# Patient Record
Sex: Female | Born: 1965 | Race: White | Hispanic: No | Smoking: Never smoker
Health system: Southern US, Community
[De-identification: ages and names within clinical notes are randomized; demographics above are authoritative.]

## PROBLEM LIST (undated history)

## (undated) DIAGNOSIS — R569 Unspecified convulsions: Secondary | ICD-10-CM

## (undated) DIAGNOSIS — Z9889 Other specified postprocedural states: Secondary | ICD-10-CM

## (undated) DIAGNOSIS — R51 Headache: Secondary | ICD-10-CM

## (undated) DIAGNOSIS — G43909 Migraine, unspecified, not intractable, without status migrainosus: Secondary | ICD-10-CM

## (undated) DIAGNOSIS — I10 Essential (primary) hypertension: Secondary | ICD-10-CM

## (undated) DIAGNOSIS — R112 Nausea with vomiting, unspecified: Secondary | ICD-10-CM

## (undated) DIAGNOSIS — I8390 Asymptomatic varicose veins of unspecified lower extremity: Secondary | ICD-10-CM

## (undated) DIAGNOSIS — R519 Headache, unspecified: Secondary | ICD-10-CM

## (undated) DIAGNOSIS — M1990 Unspecified osteoarthritis, unspecified site: Secondary | ICD-10-CM

## (undated) DIAGNOSIS — S62101A Fracture of unspecified carpal bone, right wrist, initial encounter for closed fracture: Secondary | ICD-10-CM

## (undated) DIAGNOSIS — M7700 Medial epicondylitis, unspecified elbow: Secondary | ICD-10-CM

## (undated) DIAGNOSIS — I499 Cardiac arrhythmia, unspecified: Secondary | ICD-10-CM

## (undated) DIAGNOSIS — D649 Anemia, unspecified: Secondary | ICD-10-CM

## (undated) DIAGNOSIS — H18539 Granular corneal dystrophy, unspecified eye: Secondary | ICD-10-CM

## (undated) DIAGNOSIS — H1853 Granular corneal dystrophy: Secondary | ICD-10-CM

## (undated) HISTORY — PX: DILATION AND CURETTAGE OF UTERUS: SHX78

## (undated) HISTORY — PX: DIAGNOSTIC LAPAROSCOPY: SUR761

## (undated) HISTORY — PX: VARICOSE VEIN SURGERY: SHX832

---
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
## 2006-09-02 ENCOUNTER — Ambulatory Visit: Payer: Self-pay | Admitting: Vascular Surgery

## 2006-09-17 ENCOUNTER — Ambulatory Visit: Payer: Self-pay | Admitting: Vascular Surgery

## 2006-11-05 ENCOUNTER — Ambulatory Visit: Payer: Self-pay | Admitting: Vascular Surgery

## 2006-11-11 ENCOUNTER — Ambulatory Visit: Payer: Self-pay | Admitting: Vascular Surgery

## 2006-12-16 ENCOUNTER — Ambulatory Visit: Payer: Self-pay | Admitting: Vascular Surgery

## 2008-09-23 ENCOUNTER — Encounter: Admission: RE | Admit: 2008-09-23 | Discharge: 2008-09-23 | Payer: Self-pay | Admitting: Orthopedic Surgery

## 2008-10-06 ENCOUNTER — Ambulatory Visit (HOSPITAL_COMMUNITY): Admission: RE | Admit: 2008-10-06 | Discharge: 2008-10-06 | Payer: Self-pay | Admitting: Orthopedic Surgery

## 2009-06-08 ENCOUNTER — Ambulatory Visit (HOSPITAL_BASED_OUTPATIENT_CLINIC_OR_DEPARTMENT_OTHER): Admission: RE | Admit: 2009-06-08 | Discharge: 2009-06-08 | Payer: Self-pay | Admitting: Orthopedic Surgery

## 2009-06-08 HISTORY — PX: WRIST ARTHROSCOPY W/ TRIANGULAR FIBROCARTILAGE REPAIR: SHX2670

## 2009-10-30 ENCOUNTER — Emergency Department (HOSPITAL_COMMUNITY): Admission: EM | Admit: 2009-10-30 | Discharge: 2009-10-30 | Payer: Self-pay | Admitting: Family Medicine

## 2010-11-01 LAB — POCT HEMOGLOBIN-HEMACUE: Hemoglobin: 12.4 g/dL (ref 12.0–15.0)

## 2012-08-26 ENCOUNTER — Other Ambulatory Visit: Payer: Self-pay | Admitting: Orthopedic Surgery

## 2012-08-26 DIAGNOSIS — M7710 Lateral epicondylitis, unspecified elbow: Secondary | ICD-10-CM

## 2012-08-31 ENCOUNTER — Ambulatory Visit
Admission: RE | Admit: 2012-08-31 | Discharge: 2012-08-31 | Disposition: A | Discharge status: Home / Self Care | Payer: BC Managed Care – PPO | Source: Ambulatory Visit | Attending: Orthopedic Surgery | Admitting: Orthopedic Surgery

## 2012-08-31 DIAGNOSIS — M7710 Lateral epicondylitis, unspecified elbow: Secondary | ICD-10-CM

## 2012-10-12 DIAGNOSIS — X58XXXA Exposure to other specified factors, initial encounter: Secondary | ICD-10-CM | POA: Insufficient documentation

## 2012-10-12 DIAGNOSIS — Y939 Activity, unspecified: Secondary | ICD-10-CM | POA: Insufficient documentation

## 2012-10-12 DIAGNOSIS — S139XXA Sprain of joints and ligaments of unspecified parts of neck, initial encounter: Secondary | ICD-10-CM | POA: Insufficient documentation

## 2012-10-12 DIAGNOSIS — Y929 Unspecified place or not applicable: Secondary | ICD-10-CM | POA: Insufficient documentation

## 2012-10-13 ENCOUNTER — Emergency Department (HOSPITAL_COMMUNITY)
Admission: EM | Admit: 2012-10-13 | Discharge: 2012-10-13 | Disposition: A | Discharge status: Home / Self Care | Payer: BC Managed Care – PPO | Attending: Emergency Medicine | Admitting: Emergency Medicine

## 2012-10-13 ENCOUNTER — Telehealth (HOSPITAL_COMMUNITY): Payer: Self-pay | Admitting: Emergency Medicine

## 2012-10-13 DIAGNOSIS — S161XXA Strain of muscle, fascia and tendon at neck level, initial encounter: Secondary | ICD-10-CM

## 2012-10-13 MED ORDER — HYDROCODONE-ACETAMINOPHEN 5-325 MG PO TABS
1.0000 | ORAL_TABLET | Freq: Once | ORAL | Status: AC
  Administered 2012-10-13: 1 via ORAL
  Filled 2012-10-13: qty 1

## 2012-10-13 MED ORDER — DIAZEPAM 5 MG PO TABS
5.0000 mg | ORAL_TABLET | Freq: Once | ORAL | Status: AC
  Administered 2012-10-13: 5 mg via ORAL
  Filled 2012-10-13: qty 1

## 2012-10-13 MED ORDER — DIAZEPAM 5 MG PO TABS: 5.0000 mg | ORAL_TABLET | Freq: Two times a day (BID) | ORAL | Status: DC

## 2012-10-13 MED ORDER — HYDROCODONE-ACETAMINOPHEN 5-325 MG PO TABS: 1.0000 | ORAL_TABLET | ORAL | Status: DC | PRN

## 2012-10-13 MED ORDER — KETOROLAC TROMETHAMINE 60 MG/2ML IM SOLN
60.0000 mg | Freq: Once | INTRAMUSCULAR | Status: AC
  Administered 2012-10-13: 60 mg via INTRAMUSCULAR
  Filled 2012-10-13: qty 2

## 2012-10-13 NOTE — ED Provider Notes (Signed)
Medical screening examination/treatment/procedure(s) were performed by non-physician practitioner and as supervising physician I was immediately available for consultation/collaboration.  Lyanne Co, MD 10/13/12 [phone]

## 2012-10-13 NOTE — ED Notes (Signed)
The pt  Woke up this am with pain in her neck

## 2012-10-13 NOTE — ED Notes (Signed)
Pt woke up this morning with sharp shooting pain in her neck radiating down her back. Denies trauma, falls, etc.

## 2012-10-13 NOTE — ED Notes (Signed)
Explanation given for the wait.            Husband remains at the bedside

## 2012-10-13 NOTE — ED Provider Notes (Signed)
History     CSN: [id number]  Arrival date & time 10/12/12  2356   None     Chief Complaint  Patient presents with  . Neck Pain    (Consider location/radiation/quality/duration/timing/severity/associated sxs/prior treatment) HPI History provided by pt.   Pt woke this morning w/ severe pain in right posterior neck that radiates to right scapula.  Pain is sharp and occurs only w/ movement.  Mild relief w/ ibuprofen and heating pad.  No associated fever, dyspnea, dysphagia, CP, SOB, headache, extremity weakness.  Denies trauma.    No past medical history on file.  No past surgical history on file.  No family history on file.  History  Substance Use Topics  . Smoking status: Not on file  . Smokeless tobacco: Not on file  . Alcohol Use: Not on file    OB History   No data available      Review of Systems  All other systems reviewed and are negative.    Allergies  Codeine  Home Medications   Current Outpatient Rx  Name  Route  Sig  Dispense  Refill  . ibuprofen (ADVIL,MOTRIN) 200 MG tablet   Oral   Take 800 mg by mouth every 6 (six) hours as needed for pain.           BP 157/94  Pulse 79  Temp(Src) 98.5 F (36.9 C) (Oral)  Resp 18  SpO2 96%  Physical Exam  Nursing note and vitals reviewed. Constitutional: She is oriented to person, place, and time. She appears well-developed and well-nourished. No distress.  HENT:  Head: Normocephalic and atraumatic.  Eyes:  Normal appearance  Neck: Normal range of motion.  Pt will not rotate head d/t pain.  No tenderness of cervical spine.  Severe tenderness right trap.  Pain aggravated by passive ROM RUE.  No carotid bruit.  No stridor.   Cardiovascular: Normal rate and regular rhythm.   Pulmonary/Chest: Effort normal and breath sounds normal. No respiratory distress.  Musculoskeletal: Normal range of motion.  NV RUE intact.   Neurological: She is alert and oriented to person, place, and time.  Skin: Skin is warm  and dry. No rash noted.  Psychiatric: She has a normal mood and affect. Her behavior is normal.    ED Course  Procedures (including critical care time)  Labs Reviewed - No data to display No results found.   1. Cervical strain, initial encounter       MDM  Healthy 47yo F presents w/ non-traumatic right posterior neck pain that she woke with this morning.  Suspect cervical spasm.  IM toradol and po valium and vicodin ordered.  Will reassess shortly.  3:19 AM   Pain improved and patient now able to rotate head and flex neck .  D/c'd home w/ valium, vicodin and 800mg  ibuprofen and I recommended heat and rest.  She has an orthopedist to f/u with for persistent sx.  Return precautions discussed.        Otilio Miu, PA-C 10/13/12 [phone]

## 2012-10-13 NOTE — ED Notes (Signed)
Medication given  Po and im

## 2012-10-13 NOTE — ED Notes (Signed)
The pts pain is some better but still has spasms

## 2013-09-08 ENCOUNTER — Other Ambulatory Visit: Payer: Self-pay | Admitting: Nurse Practitioner

## 2013-09-08 DIAGNOSIS — N63 Unspecified lump in unspecified breast: Secondary | ICD-10-CM

## 2013-09-16 ENCOUNTER — Other Ambulatory Visit: Payer: BC Managed Care – PPO

## 2013-09-25 ENCOUNTER — Ambulatory Visit
Admission: RE | Admit: 2013-09-25 | Discharge: 2013-09-25 | Disposition: A | Discharge status: Home / Self Care | Payer: BC Managed Care – PPO | Source: Ambulatory Visit | Attending: Nurse Practitioner | Admitting: Nurse Practitioner

## 2013-09-25 ENCOUNTER — Other Ambulatory Visit: Payer: Self-pay | Admitting: Nurse Practitioner

## 2013-09-25 DIAGNOSIS — N63 Unspecified lump in unspecified breast: Secondary | ICD-10-CM

## 2014-02-22 ENCOUNTER — Other Ambulatory Visit: Payer: Self-pay | Admitting: Family Medicine

## 2014-02-22 DIAGNOSIS — N63 Unspecified lump in unspecified breast: Secondary | ICD-10-CM

## 2014-03-03 ENCOUNTER — Ambulatory Visit
Admission: RE | Admit: 2014-03-03 | Discharge: 2014-03-03 | Disposition: A | Discharge status: Home / Self Care | Payer: BC Managed Care – PPO | Source: Ambulatory Visit | Attending: Family Medicine | Admitting: Family Medicine

## 2014-03-03 ENCOUNTER — Other Ambulatory Visit: Payer: Self-pay | Admitting: Family Medicine

## 2014-03-03 ENCOUNTER — Encounter (INDEPENDENT_AMBULATORY_CARE_PROVIDER_SITE_OTHER): Payer: Self-pay

## 2014-03-03 DIAGNOSIS — N63 Unspecified lump in unspecified breast: Secondary | ICD-10-CM

## 2014-09-29 ENCOUNTER — Other Ambulatory Visit: Payer: Self-pay | Admitting: Sports Medicine

## 2014-09-29 DIAGNOSIS — M545 Low back pain: Secondary | ICD-10-CM

## 2014-10-16 ENCOUNTER — Other Ambulatory Visit: Payer: Self-pay

## 2015-01-11 ENCOUNTER — Other Ambulatory Visit: Payer: Self-pay | Admitting: Family Medicine

## 2015-01-11 DIAGNOSIS — R921 Mammographic calcification found on diagnostic imaging of breast: Secondary | ICD-10-CM

## 2015-01-11 DIAGNOSIS — N644 Mastodynia: Secondary | ICD-10-CM

## 2015-01-20 ENCOUNTER — Ambulatory Visit
Admission: RE | Admit: 2015-01-20 | Discharge: 2015-01-20 | Disposition: A | Discharge status: Home / Self Care | Payer: 59 | Source: Ambulatory Visit | Attending: Family Medicine | Admitting: Family Medicine

## 2015-01-20 DIAGNOSIS — N644 Mastodynia: Secondary | ICD-10-CM

## 2015-01-20 DIAGNOSIS — R921 Mammographic calcification found on diagnostic imaging of breast: Secondary | ICD-10-CM

## 2015-05-18 ENCOUNTER — Other Ambulatory Visit: Payer: Self-pay | Admitting: Sports Medicine

## 2015-05-18 DIAGNOSIS — M25521 Pain in right elbow: Secondary | ICD-10-CM

## 2015-05-19 ENCOUNTER — Ambulatory Visit
Admission: RE | Admit: 2015-05-19 | Discharge: 2015-05-19 | Disposition: A | Discharge status: Home / Self Care | Payer: 59 | Source: Ambulatory Visit | Attending: Sports Medicine | Admitting: Sports Medicine

## 2015-05-19 DIAGNOSIS — M25521 Pain in right elbow: Secondary | ICD-10-CM

## 2015-05-31 ENCOUNTER — Encounter (HOSPITAL_COMMUNITY)
Admission: RE | Admit: 2015-05-31 | Discharge: 2015-05-31 | Disposition: A | Discharge status: Home / Self Care | Payer: 59 | Source: Ambulatory Visit | Attending: Obstetrics | Admitting: Obstetrics

## 2015-05-31 ENCOUNTER — Other Ambulatory Visit: Payer: Self-pay

## 2015-05-31 ENCOUNTER — Encounter (HOSPITAL_COMMUNITY): Payer: Self-pay

## 2015-05-31 DIAGNOSIS — Z01818 Encounter for other preprocedural examination: Secondary | ICD-10-CM | POA: Diagnosis not present

## 2015-05-31 DIAGNOSIS — N938 Other specified abnormal uterine and vaginal bleeding: Secondary | ICD-10-CM | POA: Insufficient documentation

## 2015-05-31 HISTORY — DX: Other specified postprocedural states: Z98.890

## 2015-05-31 HISTORY — DX: Granular corneal dystrophy: H18.53

## 2015-05-31 HISTORY — DX: Fracture of unspecified carpal bone, right wrist, initial encounter for closed fracture: S62.101A

## 2015-05-31 HISTORY — DX: Unspecified convulsions: R56.9

## 2015-05-31 HISTORY — DX: Cardiac arrhythmia, unspecified: I49.9

## 2015-05-31 HISTORY — DX: Medial epicondylitis, unspecified elbow: M77.00

## 2015-05-31 HISTORY — DX: Other specified postprocedural states: R11.2

## 2015-05-31 HISTORY — DX: Anemia, unspecified: D64.9

## 2015-05-31 HISTORY — DX: Granular corneal dystrophy, unspecified eye: H18.539

## 2015-05-31 HISTORY — DX: Unspecified osteoarthritis, unspecified site: M19.90

## 2015-05-31 HISTORY — DX: Headache, unspecified: R51.9

## 2015-05-31 HISTORY — DX: Headache: R51

## 2015-05-31 HISTORY — DX: Asymptomatic varicose veins of unspecified lower extremity: I83.90

## 2015-05-31 HISTORY — DX: Essential (primary) hypertension: I10

## 2015-05-31 LAB — BASIC METABOLIC PANEL
ANION GAP: 3 — AB (ref 5–15)
BUN: 12 mg/dL (ref 6–20)
CHLORIDE: 107 mmol/L (ref 101–111)
CO2: 27 mmol/L (ref 22–32)
Calcium: 8.6 mg/dL — ABNORMAL LOW (ref 8.9–10.3)
Creatinine, Ser: 0.68 mg/dL (ref 0.44–1.00)
GFR calc Af Amer: >60 mL/min (ref >60)
GFR calc non Af Amer: >60 mL/min (ref >60)
GLUCOSE: 133 mg/dL — AB (ref 65–99)
POTASSIUM: 3.4 mmol/L — AB (ref 3.5–5.1)
Sodium: 137 mmol/L (ref 135–145)

## 2015-05-31 LAB — TYPE AND SCREEN
ABO/RH(D): O POS
Antibody Screen: NEGATIVE

## 2015-05-31 LAB — ABO/RH: ABO/RH(D): O POS

## 2015-05-31 LAB — CBC
HEMATOCRIT: 36.4 % (ref 36.0–46.0)
HEMOGLOBIN: 11 g/dL — AB (ref 12.0–15.0)
MCH: 25.5 pg — ABNORMAL LOW (ref 26.0–34.0)
MCHC: 30.2 g/dL (ref 30.0–36.0)
MCV: 84.3 fL (ref 78.0–100.0)
Platelets: 241 10*3/uL (ref 150–400)
RBC: 4.32 MIL/uL (ref 3.87–5.11)
RDW: 15 % (ref 11.5–15.5)
WBC: 7.9 10*3/uL (ref 4.0–10.5)

## 2015-05-31 NOTE — Patient Instructions (Addendum)
Your procedure is scheduled on:  Monday, Nov. 7, 2016  [facility] at:  10:45 am  Pick up the phone at the desk and dial [phone].  Call this number if you have problems the morning of surgery: [phone].  Remember: Do NOT eat food:  AFTER MIDNIGHT SUNDAY Do NOT drink clear liquids after:  8:00 AM DAY OF SURGERY Take these medicines the morning of surgery with a SIP OF WATER:  LISINOPRIL  Do NOT wear jewelry (body piercing), metal hair clips/bobby pins, make-up, or nail polish. Do NOT wear lotions, powders, or perfumes.  You may wear deoderant. Do NOT shave for 48 hours prior to surgery. Do NOT bring valuables to the hospital. Contacts, dentures, or bridgework may not be worn into surgery. Have a responsible adult drive you home and remain with you for the first 24 hours after your procedure.

## 2015-05-31 NOTE — Pre-Procedure Instructions (Signed)
Dr. Arby BarretteHatchett made aware of abnormal EKG, patient denies mother, father, or siblings with heart disease.  Patient wants to follow up with a cardiologist, patient given a copy of today's EKG.

## 2015-06-05 NOTE — Anesthesia Preprocedure Evaluation (Addendum)
Anesthesia Evaluation  Patient identified by MRN, date of birth, ID band Patient awake    Reviewed: Allergy & Precautions, H&P , NPO status , Patient's Chart, lab work & pertinent test results  History of Anesthesia Complications (+) PONV and history of anesthetic complications  Airway Mallampati: II  TM Distance: >3 FB Neck ROM: full    Dental no notable dental hx.    Pulmonary neg pulmonary ROS,    Pulmonary exam normal breath sounds clear to auscultation       Cardiovascular hypertension, Pt. on medications Normal cardiovascular exam Rhythm:regular Rate:Normal     Neuro/Psych negative neurological ROS     GI/Hepatic negative GI ROS, Neg liver ROS,   Endo/Other  obesity  Renal/GU negative Renal ROS     Musculoskeletal  (+) Arthritis ,   Abdominal   Peds  Hematology  (+) anemia ,   Anesthesia Other Findings   Reproductive/Obstetrics negative OB ROS                            Anesthesia Physical Anesthesia Plan  ASA: II  Anesthesia Plan: General   Post-op Pain Management:    Induction: Intravenous  Airway Management Planned: LMA  Additional Equipment: None  Intra-op Plan:   Post-operative Plan: Extubation in OR  Informed Consent: I have reviewed the patients History and Physical, chart, labs and discussed the procedure including the risks, benefits and alternatives for the proposed anesthesia with the patient or authorized representative who has indicated his/her understanding and acceptance.   Dental Advisory Given  Plan Discussed with: Anesthesiologist, CRNA and Surgeon  Anesthesia Plan Comments: (GA with LMA Scop patch, decadron, zofran)        Anesthesia Quick Evaluation

## 2015-06-06 ENCOUNTER — Encounter (HOSPITAL_COMMUNITY): Payer: Self-pay

## 2015-06-06 ENCOUNTER — Encounter (HOSPITAL_COMMUNITY)
Admission: RE | Disposition: A | Discharge status: Home / Self Care | Payer: Self-pay | Source: Ambulatory Visit | Attending: Obstetrics

## 2015-06-06 ENCOUNTER — Ambulatory Visit (HOSPITAL_COMMUNITY): Payer: 59 | Admitting: Anesthesiology

## 2015-06-06 ENCOUNTER — Ambulatory Visit (HOSPITAL_COMMUNITY)
Admission: RE | Admit: 2015-06-06 | Discharge: 2015-06-06 | Disposition: A | Discharge status: Home / Self Care | Payer: 59 | Source: Ambulatory Visit | Attending: Obstetrics | Admitting: Obstetrics

## 2015-06-06 DIAGNOSIS — N939 Abnormal uterine and vaginal bleeding, unspecified: Secondary | ICD-10-CM | POA: Insufficient documentation

## 2015-06-06 DIAGNOSIS — I10 Essential (primary) hypertension: Secondary | ICD-10-CM | POA: Insufficient documentation

## 2015-06-06 DIAGNOSIS — N926 Irregular menstruation, unspecified: Secondary | ICD-10-CM | POA: Insufficient documentation

## 2015-06-06 HISTORY — PX: DILATATION & CURETTAGE/HYSTEROSCOPY WITH MYOSURE: SHX6511

## 2015-06-06 LAB — PREGNANCY, URINE: Preg Test, Ur: NEGATIVE

## 2015-06-06 SURGERY — DILATATION & CURETTAGE/HYSTEROSCOPY WITH MYOSURE
Anesthesia: General | Site: Vagina

## 2015-06-06 MED ORDER — LIDOCAINE HCL 1 % IJ SOLN
INTRAMUSCULAR | Status: AC
  Filled 2015-06-06: qty 20

## 2015-06-06 MED ORDER — KETOROLAC TROMETHAMINE 30 MG/ML IJ SOLN
INTRAMUSCULAR | Status: DC | PRN
  Administered 2015-06-06: 30 mg via INTRAVENOUS

## 2015-06-06 MED ORDER — LIDOCAINE HCL 1 % IJ SOLN
INTRAMUSCULAR | Status: DC | PRN
  Administered 2015-06-06: 10 mL

## 2015-06-06 MED ORDER — LIDOCAINE HCL (CARDIAC) 20 MG/ML IV SOLN
INTRAVENOUS | Status: DC | PRN
  Administered 2015-06-06: 100 mg via INTRAVENOUS

## 2015-06-06 MED ORDER — DEXAMETHASONE SODIUM PHOSPHATE 10 MG/ML IJ SOLN
INTRAMUSCULAR | Status: AC
  Filled 2015-06-06: qty 1

## 2015-06-06 MED ORDER — MIDAZOLAM HCL 5 MG/5ML IJ SOLN
INTRAMUSCULAR | Status: DC | PRN
  Administered 2015-06-06: 2 mg via INTRAVENOUS

## 2015-06-06 MED ORDER — LIDOCAINE HCL (CARDIAC) 20 MG/ML IV SOLN
INTRAVENOUS | Status: AC
  Filled 2015-06-06: qty 5

## 2015-06-06 MED ORDER — ONDANSETRON HCL 4 MG/2ML IJ SOLN
INTRAMUSCULAR | Status: DC | PRN
  Administered 2015-06-06: 4 mg via INTRAVENOUS

## 2015-06-06 MED ORDER — KETOROLAC TROMETHAMINE 30 MG/ML IJ SOLN
INTRAMUSCULAR | Status: AC
  Filled 2015-06-06: qty 1

## 2015-06-06 MED ORDER — ONDANSETRON HCL 4 MG/2ML IJ SOLN
INTRAMUSCULAR | Status: AC
  Filled 2015-06-06: qty 2

## 2015-06-06 MED ORDER — SODIUM CHLORIDE 0.9 % IR SOLN
Status: DC | PRN
  Administered 2015-06-06: 3000 mL

## 2015-06-06 MED ORDER — PROPOFOL 10 MG/ML IV BOLUS
INTRAVENOUS | Status: AC
  Filled 2015-06-06: qty 20

## 2015-06-06 MED ORDER — LACTATED RINGERS IV SOLN
INTRAVENOUS | Status: DC
  Administered 2015-06-06 (×2): via INTRAVENOUS

## 2015-06-06 MED ORDER — FENTANYL CITRATE (PF) 100 MCG/2ML IJ SOLN: 25.0000 ug | INTRAMUSCULAR | Status: DC | PRN

## 2015-06-06 MED ORDER — PROMETHAZINE HCL 25 MG/ML IJ SOLN: 6.2500 mg | INTRAMUSCULAR | Status: DC | PRN

## 2015-06-06 MED ORDER — FENTANYL CITRATE (PF) 100 MCG/2ML IJ SOLN
INTRAMUSCULAR | Status: DC | PRN
  Administered 2015-06-06 (×2): 50 ug via INTRAVENOUS

## 2015-06-06 MED ORDER — SCOPOLAMINE 1 MG/3DAYS TD PT72
1.0000 | MEDICATED_PATCH | Freq: Once | TRANSDERMAL | Status: DC
  Administered 2015-06-06: 1.5 mg via TRANSDERMAL

## 2015-06-06 MED ORDER — PROPOFOL 10 MG/ML IV BOLUS
INTRAVENOUS | Status: DC | PRN
  Administered 2015-06-06: 200 mg via INTRAVENOUS

## 2015-06-06 MED ORDER — SCOPOLAMINE 1 MG/3DAYS TD PT72
MEDICATED_PATCH | TRANSDERMAL | Status: AC
  Administered 2015-06-06: 1.5 mg via TRANSDERMAL
  Filled 2015-06-06: qty 1

## 2015-06-06 MED ORDER — HYDROCODONE-ACETAMINOPHEN 5-325 MG PO TABS: 1.0000 | ORAL_TABLET | Freq: Four times a day (QID) | ORAL | Status: DC | PRN

## 2015-06-06 MED ORDER — DEXAMETHASONE SODIUM PHOSPHATE 4 MG/ML IJ SOLN
INTRAMUSCULAR | Status: DC | PRN
  Administered 2015-06-06: 10 mg via INTRAVENOUS

## 2015-06-06 MED ORDER — IBUPROFEN 800 MG PO TABS: 800.0000 mg | ORAL_TABLET | Freq: Four times a day (QID) | ORAL | Status: DC | PRN

## 2015-06-06 MED ORDER — MIDAZOLAM HCL 2 MG/2ML IJ SOLN
INTRAMUSCULAR | Status: AC
  Filled 2015-06-06: qty 4

## 2015-06-06 MED ORDER — FENTANYL CITRATE (PF) 100 MCG/2ML IJ SOLN
INTRAMUSCULAR | Status: AC
  Filled 2015-06-06: qty 4

## 2015-06-06 SURGICAL SUPPLY — 15 items
CATH ROBINSON RED A/P 16FR (CATHETERS) ×3 IMPLANT
CLOTH BEACON ORANGE TIMEOUT ST (SAFETY) ×3 IMPLANT
CONTAINER PREFILL 10% NBF 60ML (FORM) ×6 IMPLANT
FILTER ARTHROSCOPY CONVERTOR (FILTER) ×3 IMPLANT
GLOVE BIO SURGEON STRL SZ 6 (GLOVE) ×3 IMPLANT
GLOVE BIOGEL PI IND STRL 6.5 (GLOVE) ×2 IMPLANT
GLOVE BIOGEL PI INDICATOR 6.5 (GLOVE) ×1
GOWN STRL REUS W/TWL LRG LVL3 (GOWN DISPOSABLE) ×6 IMPLANT
PACK VAGINAL MINOR WOMEN LF (CUSTOM PROCEDURE TRAY) ×3 IMPLANT
PAD OB MATERNITY 4.3X12.25 (PERSONAL CARE ITEMS) ×3 IMPLANT
SEAL ROD LENS SCOPE MYOSURE (ABLATOR) ×3 IMPLANT
TOWEL OR 17X24 6PK STRL BLUE (TOWEL DISPOSABLE) ×6 IMPLANT
TUBING AQUILEX INFLOW (TUBING) ×3 IMPLANT
TUBING AQUILEX OUTFLOW (TUBING) ×3 IMPLANT
WATER STERILE IRR 1000ML POUR (IV SOLUTION) ×3 IMPLANT

## 2015-06-06 NOTE — H&P (Signed)
49 y.o. presents with irregular menstrual bleeding.  Heavy menses w flooding, some months lighter. In July bled for three weeks straight, then 1 week off, again for two weeks. TVUS at last visit showed uterus of 8 x 5 x 5 cm, EMS thickened at 1.97 cm with irregularities c/w endometrial polyps.  She was referred by Dr. Aldona BarWein for hysteroscopy, polypectomy, dilation and curettage.   Past Medical History  Diagnosis Date  . Right wrist fracture   . Dysrhythmia     heart racing with hot flashes  . Hypertension   . Varicose veins   . Headache     Migraines  . Seizures (HCC)     Febrile, at age 89 years  . Epicondylitis elbow, medial   . Arthritis     left knee  . Anemia   . Granular corneal dystrophy   . PONV (postoperative nausea and vomiting)     Past Surgical History  Procedure Laterality Date  . Diagnostic laparoscopy      ovarian cyst removed  . Lymph node dissection    . Dilation and curettage of uterus    . Varicose vein surgery    . Wrist arthroscopy       Social History   Social History  . Marital Status: Married    Spouse Name: N/A  . Number of Children: N/A  . Years of Education: N/A   Occupational History  . Not on file.   Social History Main Topics  . Smoking status: Never Smoker   . Smokeless tobacco: Never Used  . Alcohol Use: No  . Drug Use: No  . Sexual Activity: Yes    Birth Control/ Protection: Condom   Other Topics Concern  . Not on file   Social History Narrative   Codeine   Other PNC: uncomplicated.  ABO, Rh: --/--/O POS, O POS (11/01 1225) Antibody: NEG (11/01 1225)  Filed Vitals:   06/06/15 1037  BP: 133/93  Pulse: 86  Temp: 98.4 F (36.9 C)  Resp: 20     General:  NAD  Ex:  no edema     A/P   49 y.o.  Female with irregular menstrual bleeding presents of hysteroscopy, polypectomy, dilation and curettage for irregular menstrual bleeding.  Desires to proceed with surgical management.  Discussed risks to include infection,  bleeding, damage to surrounding structures (including but not limited to vagina, cervix, bladder, uterus, bowel), uterine perforation, need for additional surgical procedures, blood clot, need for blood transfusion. Discussed if polyp or other intrauterine pathology is identified on hysteroscopy, removal would be attempted if possible. Discussed that we cannot guarantee return to normal menses, but if polyp is present, irregular menses should improve.  Pt agrees and desires surgical management. All questions answered and patient elects to proceed.  All questions answered and patient elects to proceed.    [facility] GEFFEL [name]

## 2015-06-06 NOTE — Op Note (Signed)
Pre-Operative Diagnosis: Abnormal uterine bleeding, suspected endometrial polyp  Postoperative Diagnosis: Abnormal uterine bleeding  Procedure: Hysteroscopy, dilation and curettage  Surgeon: Marlow Baarsyanna Mathilda Maguire, MD  Operative Findings: Uterus sounded to 9 cm, tubal ostia identified bilaterally.  No endometrial polyps identified.  On the posterior aspect of the uterus, near the fundus, very ragged endometrium, possibly base of expelled polyp.   Specimen endometrial curettings  EBL: minimal     After adequate anesthesia was achieved, the patient placed in the dorsal lithotomy position in [place] stirrups.  She was prepped and draped in the usual sterile fashion.  The speculum was placed in the vagina and the anterior lip of the cervix grasped with a single-tooth tenaculum.   10cc of 1% lidocaine was administered in a paracervical fashion. The cervix was serially dilated with Hank dilators. The hysteroscope was advanced under direct visualization.  The cavity was examined and no endometrial polyps were identified.  As above, at the posterior aspect of the uterus, near the fundus, very ragged endometrium, possibly base of expelled polyp.  At this time, the hysteroscope was removed. A Sharp curettage was performed with a moderate amount of tissue obtained until a gritty texture was noted in all four quadrants.    All instruments were removed from the vagina.  Pressure was used to achieve hemostasis at the tenaculum site. The patient tolerated the procedure well was brought to the recovery room in stable condition for the procedure. All sponge and needle counts correct x2.   [place], [facility]

## 2015-06-06 NOTE — Anesthesia Postprocedure Evaluation (Signed)
  Anesthesia Post-op Note  Patient: Stacy Harris  Procedure(s) Performed: Procedure(s) (LRB): DILATATION & CURETTAGE/HYSTEROSCOPY  (N/A)  Patient Location: PACU  Anesthesia Type: General  Level of Consciousness: awake and alert   Airway and Oxygen Therapy: Patient Spontanous Breathing  Post-op Pain: mild  Post-op Assessment: Post-op Vital signs reviewed, Patient's Cardiovascular Status Stable, Respiratory Function Stable, Patent Airway and No signs of Nausea or vomiting  Last Vitals:  Filed Vitals:   06/06/15 1037  BP: 133/93  Pulse: 86  Temp: 36.9 C  Resp: 20    Post-op Vital Signs: stable   Complications: No apparent anesthesia complications

## 2015-06-06 NOTE — Anesthesia Procedure Notes (Signed)
Procedure Name: LMA Insertion Date/Time: 06/06/2015 12:04 PM Performed by: Junious SilkGILBERT, Anginette Espejo Pre-anesthesia Checklist: Patient identified, Emergency Drugs available, Suction available, Patient being monitored and Timeout performed Patient Re-evaluated:Patient Re-evaluated prior to inductionOxygen Delivery Method: Circle system utilized Preoxygenation: Pre-oxygenation with 100% oxygen Intubation Type: IV induction Ventilation: Mask ventilation without difficulty LMA: LMA inserted LMA Size: 4.0 Number of attempts: 1 Placement Confirmation: positive ETCO2,  CO2 detector and breath sounds checked- equal and bilateral Tube secured with: Tape Dental Injury: Teeth and Oropharynx as per pre-operative assessment

## 2015-06-06 NOTE — Transfer of Care (Signed)
Immediate Anesthesia Transfer of Care Note  Patient: Stacy Harris  Procedure(s) Performed: Procedure(s): DILATATION & CURETTAGE/HYSTEROSCOPY  (N/A)  Patient Location: PACU  Anesthesia Type:General  Level of Consciousness: awake, alert  and oriented  Airway & Oxygen Therapy: Patient Spontanous Breathing and Patient connected to nasal cannula oxygen  Post-op Assessment: Report given to RN and Post -op Vital signs reviewed and stable  Post vital signs: Reviewed and stable  Last Vitals:  Filed Vitals:   06/06/15 1037  BP: 133/93  Pulse: 86  Temp: 36.9 C  Resp: 20    Complications: No apparent anesthesia complications

## 2015-06-06 NOTE — Brief Op Note (Signed)
06/06/2015  12:29 PM  PATIENT:  Stacy Harris  49 y.o. female  PRE-OPERATIVE DIAGNOSIS:  Dysfunctional uterine bleeding  POST-OPERATIVE DIAGNOSIS:  Dysfunctional uterine bleeding  PROCEDURE:  Procedure(s): DILATATION & CURETTAGE/HYSTEROSCOPY  (N/A)  SURGEON:  Surgeon(s) and Role:    * Marlow Baarsyanna Cassandra Harbold, MD - Primary  ANESTHESIA:   general  EBL:  Total I/O In: 1000 [I.V.:1000] Out: 60 [Urine:50; Blood:10]  BLOOD ADMINISTERED:none  DRAINS: none   LOCAL MEDICATIONS USED:  LIDOCAINE   SPECIMEN:  Source of Specimen:  endometrial curettings  DISPOSITION OF SPECIMEN:  PATHOLOGY  COUNTS:  YES  TOURNIQUET:  * No tourniquets in log *  DICTATION: .Note written in EPIC  PLAN OF CARE: Discharge to home after PACU  PATIENT DISPOSITION:  PACU - hemodynamically stable.   Delay start of Pharmacological VTE agent (>24hrs) due to surgical blood loss or risk of bleeding: not applicable

## 2015-06-06 NOTE — Discharge Instructions (Signed)
NO IBUPROFEN PRODUCTS BEFORE 6:30 PM   Pelvic rest x 2 weeks (no intercourse or tampons).  No tub baths or swimming for two weeks.    Do not drive until you are not taking narcotic pain medication.   Call your doctor if you have heavy vaginal bleeding (soaking through a pad an hour or more for >2 hours in a row), temperature >101F, severe nausea, vomiting, severe or worsening abdominal pain, dizziness, shortness of breath, chest pain or any other concerns.  Please take motrin every 6 hours.  Add Norco only if your pain is not controlled by motrin alone.           Dilation and Curettage Vacuum Curettage, Care After    These instructions give you information on caring for yourself after your procedure. Your doctor may also give you more specific instructions. Call your doctor if you have any problems or questions after your procedure.  HOME CARE  Do not drive for 24 hours.  Wait 1 week before doing any activities that wear you out.  Take your temperature 2 times a day for 4 days. Write it down. Tell your doctor if you have a fever.  Do not stand for a long time.  Do not lift, push, or pull anything over 10 pounds (4.5 kilograms).  Limit stair climbing to once or twice a day.  Rest often.  Continue with your usual diet.  Drink enough fluids to keep your pee (urine) clear or pale yellow.  If you have a hard time pooping (constipation), you may:  Take a medicine to help you go poop (laxative) as told by your doctor.  Eat more fruit and bran.  Drink more fluids. Take showers, not baths, for as long as told by your doctor.  Do not swim or use a hot tub until your doctor says it is okay.  Have someone with you for 1-2 days after the procedure.  Do not douche, use tampons, or have sex (intercourse) for 2 weeks.  Only take medicines as told by your doctor. Do not take aspirin. It can cause bleeding.  Keep all doctor visits. GET HELP IF:  You have cramps or pain not helped by  medicine.  You have new pain in the belly (abdomen).  You have a bad smelling fluid coming from your vagina.  You have a rash.  You have problems with any medicine. GET HELP RIGHT AWAY IF:  You start to bleed more than a regular period.  You have a fever.  You have chest pain.  You have trouble breathing.  You feel dizzy or feel like passing out (fainting).  You pass out.  You have pain in the tops of your shoulders.  You have vaginal bleeding with or without clumps of blood (blood clots). MAKE SURE YOU:  Understand these instructions.  Will watch your condition.  Will get help right away if you are not doing well or get worse. This information is not intended to replace advice given to you by your health care provider. Make sure you discuss any questions you have with your health care provider.  Document Released: 04/24/2008 Document Revised: 07/21/2013 Document Reviewed: 02/12/2013  Elsevier Interactive Patient Education [employer]2016 Elsevier Inc.

## 2015-06-07 ENCOUNTER — Encounter (HOSPITAL_COMMUNITY): Payer: Self-pay | Admitting: Obstetrics

## 2015-06-16 ENCOUNTER — Encounter: Payer: Self-pay | Admitting: Internal Medicine

## 2015-06-16 ENCOUNTER — Ambulatory Visit (INDEPENDENT_AMBULATORY_CARE_PROVIDER_SITE_OTHER): Payer: 59 | Admitting: Internal Medicine

## 2015-06-16 VITALS — BP 126/90 | HR 89 | Ht 65.5 in | Wt 225.0 lb

## 2015-06-16 DIAGNOSIS — R0789 Other chest pain: Secondary | ICD-10-CM

## 2015-06-16 DIAGNOSIS — I10 Essential (primary) hypertension: Secondary | ICD-10-CM

## 2015-06-16 DIAGNOSIS — R9431 Abnormal electrocardiogram [ECG] [EKG]: Secondary | ICD-10-CM | POA: Diagnosis not present

## 2015-06-16 NOTE — Patient Instructions (Signed)
Your physician has requested that you have an exercise tolerance test. For further information please visit [email or web address]. Please also follow instruction sheet, as given.  Your physician recommends that you schedule a follow-up appointment after your test.   

## 2015-06-16 NOTE — Progress Notes (Signed)
OFFICE NOTE  Chief Complaint:  Abnormal EKG  Primary Care Physician: Herb Grays, MD  HPI:  Stacy Harris is a pleasant 49 year old female who is currently referred for evaluation of abnormal EKG. Recently she found out her sister had uterine cancer and had some abnormalities on her uterine exam. She then went in for a hysteroscopy and partial D&C. Fortunately there was no evidence for cancer, but preoperatively she had an EKG which was abnormal demonstrating a poor R-wave progression anteriorly and possible anterior infarct. She was then referred to cardiology for evaluation. She cannot recall any prior event that sounded like an acute MI. She does have a history of hypertension although no other known coronary problems. There is no significant coronary disease in her family, more likely cancer. She denies any significant shortness of breath but does get short of breath walking up stairs, which she contributes her recent weight gain. There is some occasional sharp chest discomfort which is not necessarily associated with exertion or relieved by rest. She's not had high cholesterol or diabetes and is a nonsmoker.  PMHx:  Past Medical History  Diagnosis Date  . Right wrist fracture   . Dysrhythmia     heart racing with hot flashes  . Hypertension   . Varicose veins   . Headache     Migraines  . Seizures (HCC)     Febrile, at age 15 years  . Epicondylitis elbow, medial   . Arthritis     left knee  . Anemia   . Granular corneal dystrophy   . PONV (postoperative nausea and vomiting)     Past Surgical History  Procedure Laterality Date  . Diagnostic laparoscopy      ovarian cyst removed  . Lymph node dissection    . Dilation and curettage of uterus    . Varicose vein surgery    . Wrist arthroscopy    . Dilatation & curettage/hysteroscopy with myosure N/A 06/06/2015    Procedure: DILATATION & CURETTAGE/HYSTEROSCOPY ;  Surgeon: Marlow Baars, MD;  Location: [facility];  Service:  Gynecology;  Laterality: N/A;    FAMHx:  No family history on file.  SOCHx:   reports that she has never smoked. She has never used smokeless tobacco. She reports that she does not drink alcohol or use illicit drugs.  ALLERGIES:  Allergies  Allergen Reactions  . Codeine Rash    ROS: A comprehensive review of systems was negative except for: Genitourinary: positive for abnormal menstrual periods and hot flashes  HOME MEDS: Current Outpatient Prescriptions  Medication Sig Dispense Refill  . aspirin-acetaminophen-caffeine (EXCEDRIN MIGRAINE) 250-250-65 MG tablet Take 2 tablets by mouth every 6 (six) hours as needed for migraine.    . Ferrous Sulfate (IRON) 325 (65 FE) MG TABS Take 1 tablet by mouth daily.    Marland Kitchen ibuprofen (ADVIL,MOTRIN) 800 MG tablet Take 1 tablet (800 mg total) by mouth every 6 (six) hours as needed for moderate pain or cramping. 30 tablet 0  . Lidocaine HCl-Epinephrine (LIDOSITE SYSTEM EX) Apply 1 patch topically See admin instructions. Placed on right arm every other physical therapy treatment    . lisinopril (PRINIVIL,ZESTRIL) 10 MG tablet Take 10 mg by mouth daily.    . Multiple Vitamin (MULTIVITAMIN WITH MINERALS) TABS tablet Take 1 tablet by mouth daily.     No current facility-administered medications for this visit.    LABS/IMAGING: No results found for this or any previous visit (from the past 48 hour(s)). No results found.  WEIGHTS: Wt Readings from Last 3 Encounters:  06/16/15 225 lb (102.059 kg)  05/31/15 225 lb 2 oz (102.116 kg)    VITALS: BP 126/90 mmHg  Pulse 89  Ht 5' 5.5" (1.664 m)  Wt 225 lb (102.059 kg)  BMI 36.86 kg/m2  SpO2 97%  LMP 05/29/2015 (Exact Date)  EXAM: General appearance: alert, no distress and moderately obese Neck: no carotid bruit and no JVD Lungs: clear to auscultation bilaterally Heart: regular rate and rhythm, S1, S2 normal, no murmur, click, rub or gallop Abdomen: soft, non-tender; bowel sounds normal; no  masses,  no organomegaly Extremities: edema None Pulses: 2+ and symmetric Skin: Skin color, texture, turgor normal. No rashes or lesions Neurologic: Grossly normal Psych: Pleasant  EKG: Normal sinus rhythm with sinus arrhythmia at 76  ASSESSMENT: 1. Abnormal EKG with poor R-wave progression and transition in lead V5 2. Hypertension-controlled 3. Atypical chest pain  PLAN: 1.   Stacy Harris has an abnormal EKG which I repeated today in the office. It demonstrates normal sinus rhythm with mild sinus arrhythmia. There is a late transition of the R-wave in lead V5. I do not believe this represents a prior anterior MI and our computer interpretation agrees with that. She does have hypertension, at few other risk factors other than obesity. She gets some mild shortness of breath with exertion and is interested in starting a more rigorous exercise program. I would recommend a plain treadmill stress test to evaluate for any significant ischemia and her exercise capacity. If she is able to do fairly well on the stress test without EKG changes, I would not consider the EKG changes to represent prior infarct and recommend that she start an exercise and weight loss program.  Plan to see her back to discuss those results in a few weeks. Thanks again for the kind referral.  Chrystie NoseKenneth C. Kolbie Lepkowski, MD, [facility] Attending Cardiologist [facility]  Chrystie NoseKenneth C Brier Firebaugh 06/16/2015, 10:35 AM

## 2015-06-27 ENCOUNTER — Encounter: Payer: Self-pay | Admitting: Internal Medicine

## 2015-07-12 ENCOUNTER — Other Ambulatory Visit: Payer: Self-pay | Admitting: Physician Assistant

## 2015-07-12 NOTE — H&P (Addendum)
Stacy Harris comes in for evaluation of her right elbow to discuss definitive treatment.  Symptoms in the lateral aspect of the right elbow.  This is going on since she had a traumatic event back in 2009.  If she is inactive and doesn't use much she can tolerate things, but as soon as she returns to any activity, especially any repetitive motion of the right upper extremity, she gets recurrent symptoms.  Again, all of this is isolated laterally.  Treatment with therapy, anti-inflammatories and injection have all been transiently helpful, but as soon as she returns to activity her symptoms come back.  I have reviewed a recent MRI scan of her elbow.  Picture of tendinopathy proximal common extensor tendon.  Although they discussed this is minimal on her scan I have looked at it and I think it is a little bit more dramatic than that and very isolated to that area.  All other structures in her elbow look good.   History, workup and treatment to date are reviewed.  General exam is outlined.  I have also gone over her MRI report, as well as the scan itself.    EXAMINATION: Lungs clear to auscultation bilaterally.  Heart sounds normal. Specifically, she has point tenderness lateral epicondyle, extensor attachment, right elbow.  Ligaments are stable.  She has full motion.  Neurovascularly intact.  Any resisted use of extensor very painful.    DISPOSITION:  Although her injury is not profound in terms of magnitude, the persistence of symptoms now going on for 7 years is certainly sufficient to warrant definitive treatment.  I don't think anything further conservatively is going to allow things to resolve.  We have discussed lateral exploration.  Debridement and repair of extensor tendons.  Procedure, risks, benefits and complications reviewed.  Anticipated recovery, rehab and outcome outlined.  I have cautioned her this has been going on for a long time and it is going to be a good 4-6 months until it is completely resolved,  even after operative treatment.  She would like to proceed.    Loreta Aveaniel F. Murphy, M.D.

## 2015-07-13 ENCOUNTER — Telehealth (HOSPITAL_COMMUNITY): Payer: Self-pay

## 2015-07-13 NOTE — Telephone Encounter (Signed)
Encounter complete. 

## 2015-07-14 ENCOUNTER — Encounter (HOSPITAL_BASED_OUTPATIENT_CLINIC_OR_DEPARTMENT_OTHER): Payer: Self-pay | Admitting: *Deleted

## 2015-07-15 ENCOUNTER — Encounter (HOSPITAL_BASED_OUTPATIENT_CLINIC_OR_DEPARTMENT_OTHER): Payer: Self-pay | Admitting: *Deleted

## 2015-07-15 ENCOUNTER — Ambulatory Visit (HOSPITAL_COMMUNITY)
Admission: RE | Admit: 2015-07-15 | Discharge: 2015-07-15 | Disposition: A | Discharge status: Home / Self Care | Payer: 59 | Source: Ambulatory Visit | Attending: Internal Medicine | Admitting: Internal Medicine

## 2015-07-15 DIAGNOSIS — R9431 Abnormal electrocardiogram [ECG] [EKG]: Secondary | ICD-10-CM

## 2015-07-15 DIAGNOSIS — R0789 Other chest pain: Secondary | ICD-10-CM | POA: Insufficient documentation

## 2015-07-15 LAB — EXERCISE TOLERANCE TEST
CHL RATE OF PERCEIVED EXERTION: 16
CSEPED: 7 min
CSEPEW: 8.5 METS
CSEPPHR: 160 {beats}/min
MPHR: 173 {beats}/min
Percent HR: 92 %
Rest HR: 86 {beats}/min

## 2015-07-20 ENCOUNTER — Encounter (HOSPITAL_COMMUNITY): Payer: 59

## 2015-07-21 ENCOUNTER — Encounter (HOSPITAL_BASED_OUTPATIENT_CLINIC_OR_DEPARTMENT_OTHER): Payer: Self-pay | Admitting: *Deleted

## 2015-07-21 ENCOUNTER — Ambulatory Visit (HOSPITAL_BASED_OUTPATIENT_CLINIC_OR_DEPARTMENT_OTHER): Payer: 59 | Admitting: Anesthesiology

## 2015-07-21 ENCOUNTER — Ambulatory Visit (HOSPITAL_BASED_OUTPATIENT_CLINIC_OR_DEPARTMENT_OTHER)
Admission: RE | Admit: 2015-07-21 | Discharge: 2015-07-21 | Disposition: A | Discharge status: Home / Self Care | Payer: 59 | Source: Ambulatory Visit | Attending: Orthopedic Surgery | Admitting: Orthopedic Surgery

## 2015-07-21 ENCOUNTER — Encounter (HOSPITAL_BASED_OUTPATIENT_CLINIC_OR_DEPARTMENT_OTHER)
Admission: RE | Disposition: A | Discharge status: Home / Self Care | Payer: Self-pay | Source: Ambulatory Visit | Attending: Orthopedic Surgery

## 2015-07-21 DIAGNOSIS — E669 Obesity, unspecified: Secondary | ICD-10-CM | POA: Insufficient documentation

## 2015-07-21 DIAGNOSIS — X58XXXA Exposure to other specified factors, initial encounter: Secondary | ICD-10-CM | POA: Insufficient documentation

## 2015-07-21 DIAGNOSIS — D649 Anemia, unspecified: Secondary | ICD-10-CM | POA: Diagnosis not present

## 2015-07-21 DIAGNOSIS — Z79899 Other long term (current) drug therapy: Secondary | ICD-10-CM | POA: Diagnosis not present

## 2015-07-21 DIAGNOSIS — M1990 Unspecified osteoarthritis, unspecified site: Secondary | ICD-10-CM | POA: Diagnosis not present

## 2015-07-21 DIAGNOSIS — Z6838 Body mass index (BMI) 38.0-38.9, adult: Secondary | ICD-10-CM | POA: Insufficient documentation

## 2015-07-21 DIAGNOSIS — S56511A Strain of other extensor muscle, fascia and tendon at forearm level, right arm, initial encounter: Secondary | ICD-10-CM | POA: Diagnosis not present

## 2015-07-21 DIAGNOSIS — R569 Unspecified convulsions: Secondary | ICD-10-CM | POA: Diagnosis not present

## 2015-07-21 DIAGNOSIS — I10 Essential (primary) hypertension: Secondary | ICD-10-CM | POA: Insufficient documentation

## 2015-07-21 DIAGNOSIS — M7711 Lateral epicondylitis, right elbow: Secondary | ICD-10-CM | POA: Insufficient documentation

## 2015-07-21 HISTORY — PX: TENNIS ELBOW RELEASE/NIRSCHEL PROCEDURE: SHX6651

## 2015-07-21 SURGERY — TENNIS ELBOW RELEASE/NIRSCHEL PROCEDURE
Anesthesia: General | Site: Elbow | Laterality: Right

## 2015-07-21 MED ORDER — OXYCODONE HCL 5 MG/5ML PO SOLN: 5.0000 mg | Freq: Once | ORAL | Status: DC | PRN

## 2015-07-21 MED ORDER — ONDANSETRON HCL 4 MG/2ML IJ SOLN
INTRAMUSCULAR | Status: AC
  Filled 2015-07-21: qty 2

## 2015-07-21 MED ORDER — MIDAZOLAM HCL 2 MG/2ML IJ SOLN
INTRAMUSCULAR | Status: AC
  Filled 2015-07-21: qty 2

## 2015-07-21 MED ORDER — PROPOFOL 10 MG/ML IV BOLUS
INTRAVENOUS | Status: AC
  Filled 2015-07-21: qty 20

## 2015-07-21 MED ORDER — PROMETHAZINE HCL 25 MG/ML IJ SOLN: 6.2500 mg | INTRAMUSCULAR | Status: DC | PRN

## 2015-07-21 MED ORDER — METHOCARBAMOL 1000 MG/10ML IJ SOLN: 500.0000 mg | Freq: Four times a day (QID) | INTRAVENOUS | Status: DC | PRN

## 2015-07-21 MED ORDER — FENTANYL CITRATE (PF) 100 MCG/2ML IJ SOLN
INTRAMUSCULAR | Status: AC
  Filled 2015-07-21: qty 2

## 2015-07-21 MED ORDER — HYDROMORPHONE HCL 1 MG/ML IJ SOLN: 0.5000 mg | INTRAMUSCULAR | Status: DC | PRN

## 2015-07-21 MED ORDER — HYDROCODONE-ACETAMINOPHEN 7.5-325 MG PO TABS: 1.0000 | ORAL_TABLET | ORAL | Status: DC | PRN

## 2015-07-21 MED ORDER — BUPIVACAINE HCL (PF) 0.25 % IJ SOLN
INTRAMUSCULAR | Status: AC
  Filled 2015-07-21: qty 30

## 2015-07-21 MED ORDER — MIDAZOLAM HCL 2 MG/2ML IJ SOLN
1.0000 mg | INTRAMUSCULAR | Status: DC | PRN
  Administered 2015-07-21: 2 mg via INTRAVENOUS

## 2015-07-21 MED ORDER — FENTANYL CITRATE (PF) 100 MCG/2ML IJ SOLN
50.0000 ug | INTRAMUSCULAR | Status: AC | PRN
  Administered 2015-07-21 (×2): 25 ug via INTRAVENOUS
  Administered 2015-07-21: 50 ug via INTRAVENOUS
  Administered 2015-07-21: 100 ug via INTRAVENOUS

## 2015-07-21 MED ORDER — SCOPOLAMINE 1 MG/3DAYS TD PT72
1.0000 | MEDICATED_PATCH | Freq: Once | TRANSDERMAL | Status: DC
  Administered 2015-07-21: 1.5 mg via TRANSDERMAL

## 2015-07-21 MED ORDER — SCOPOLAMINE 1 MG/3DAYS TD PT72
MEDICATED_PATCH | TRANSDERMAL | Status: AC
  Filled 2015-07-21: qty 1

## 2015-07-21 MED ORDER — ONDANSETRON HCL 4 MG/2ML IJ SOLN
INTRAMUSCULAR | Status: DC | PRN
  Administered 2015-07-21: 4 mg via INTRAVENOUS

## 2015-07-21 MED ORDER — ONDANSETRON HCL 4 MG PO TABS: 4.0000 mg | ORAL_TABLET | Freq: Three times a day (TID) | ORAL | Status: DC | PRN

## 2015-07-21 MED ORDER — PROPOFOL 10 MG/ML IV BOLUS
INTRAVENOUS | Status: DC | PRN
  Administered 2015-07-21: 200 mg via INTRAVENOUS

## 2015-07-21 MED ORDER — LIDOCAINE HCL (CARDIAC) 20 MG/ML IV SOLN
INTRAVENOUS | Status: AC
  Filled 2015-07-21: qty 5

## 2015-07-21 MED ORDER — MEPERIDINE HCL 25 MG/ML IJ SOLN: 6.2500 mg | INTRAMUSCULAR | Status: DC | PRN

## 2015-07-21 MED ORDER — HYDROMORPHONE HCL 1 MG/ML IJ SOLN
0.2500 mg | INTRAMUSCULAR | Status: DC | PRN
  Administered 2015-07-21 (×4): 0.5 mg via INTRAVENOUS

## 2015-07-21 MED ORDER — CHLORHEXIDINE GLUCONATE 4 % EX LIQD: 60.0000 mL | Freq: Once | CUTANEOUS | Status: DC

## 2015-07-21 MED ORDER — CEFAZOLIN SODIUM-DEXTROSE 2-3 GM-% IV SOLR
2.0000 g | INTRAVENOUS | Status: AC
  Administered 2015-07-21: 2 g via INTRAVENOUS

## 2015-07-21 MED ORDER — LIDOCAINE HCL (CARDIAC) 20 MG/ML IV SOLN
INTRAVENOUS | Status: DC | PRN
  Administered 2015-07-21: 50 mg via INTRAVENOUS

## 2015-07-21 MED ORDER — METHOCARBAMOL 500 MG PO TABS: 500.0000 mg | ORAL_TABLET | Freq: Four times a day (QID) | ORAL | Status: DC | PRN

## 2015-07-21 MED ORDER — OXYCODONE HCL 5 MG PO TABS: 5.0000 mg | ORAL_TABLET | Freq: Once | ORAL | Status: DC | PRN

## 2015-07-21 MED ORDER — HYDROMORPHONE HCL 1 MG/ML IJ SOLN
INTRAMUSCULAR | Status: AC
  Filled 2015-07-21: qty 1

## 2015-07-21 MED ORDER — BUPIVACAINE HCL (PF) 0.25 % IJ SOLN
INTRAMUSCULAR | Status: DC | PRN
  Administered 2015-07-21: 10 mL

## 2015-07-21 MED ORDER — ONDANSETRON HCL 4 MG PO TABS: 4.0000 mg | ORAL_TABLET | Freq: Four times a day (QID) | ORAL | Status: DC | PRN

## 2015-07-21 MED ORDER — DEXAMETHASONE SODIUM PHOSPHATE 10 MG/ML IJ SOLN
INTRAMUSCULAR | Status: AC
  Filled 2015-07-21: qty 1

## 2015-07-21 MED ORDER — GLYCOPYRROLATE 0.2 MG/ML IJ SOLN: 0.2000 mg | Freq: Once | INTRAMUSCULAR | Status: DC | PRN

## 2015-07-21 MED ORDER — LACTATED RINGERS IV SOLN
INTRAVENOUS | Status: DC
  Administered 2015-07-21 (×2): via INTRAVENOUS

## 2015-07-21 MED ORDER — CEFAZOLIN SODIUM-DEXTROSE 2-3 GM-% IV SOLR
INTRAVENOUS | Status: AC
Start: 2015-07-21 — End: 2015-07-21
  Filled 2015-07-21: qty 50

## 2015-07-21 MED ORDER — HYDROCODONE-ACETAMINOPHEN 10-325 MG PO TABS: 1.0000 | ORAL_TABLET | ORAL | Status: DC | PRN

## 2015-07-21 MED ORDER — LACTATED RINGERS IV SOLN
INTRAVENOUS | Status: DC
  Administered 2015-07-21: 09:00:00 via INTRAVENOUS

## 2015-07-21 MED ORDER — METOCLOPRAMIDE HCL 5 MG/ML IJ SOLN: 5.0000 mg | Freq: Three times a day (TID) | INTRAMUSCULAR | Status: DC | PRN

## 2015-07-21 MED ORDER — DEXAMETHASONE SODIUM PHOSPHATE 10 MG/ML IJ SOLN
INTRAMUSCULAR | Status: DC | PRN
  Administered 2015-07-21: 10 mg via INTRAVENOUS

## 2015-07-21 MED ORDER — METOCLOPRAMIDE HCL 5 MG PO TABS: 5.0000 mg | ORAL_TABLET | Freq: Three times a day (TID) | ORAL | Status: DC | PRN

## 2015-07-21 MED ORDER — ONDANSETRON HCL 4 MG/2ML IJ SOLN: 4.0000 mg | Freq: Four times a day (QID) | INTRAMUSCULAR | Status: DC | PRN

## 2015-07-21 SURGICAL SUPPLY — 66 items
APL SKNCLS STERI-STRIP NONHPOA (GAUZE/BANDAGES/DRESSINGS) ×1
BANDAGE ACE 4X5 VEL STRL LF (GAUZE/BANDAGES/DRESSINGS) ×4 IMPLANT
BENZOIN TINCTURE PRP APPL 2/3 (GAUZE/BANDAGES/DRESSINGS) ×1 IMPLANT
BLADE SURG 15 STRL LF DISP TIS (BLADE) ×1 IMPLANT
BLADE SURG 15 STRL SS (BLADE) ×2
BNDG CMPR 9X4 STRL LF SNTH (GAUZE/BANDAGES/DRESSINGS) ×1
BNDG COHESIVE 4X5 TAN STRL (GAUZE/BANDAGES/DRESSINGS) ×2 IMPLANT
BNDG ESMARK 4X9 LF (GAUZE/BANDAGES/DRESSINGS) ×2 IMPLANT
CANISTER SUCT 1200ML W/VALVE (MISCELLANEOUS) ×2 IMPLANT
COVER BACK TABLE 60X90IN (DRAPES) ×2 IMPLANT
CUFF TOURN SGL LL 18 NRW (TOURNIQUET CUFF) ×3 IMPLANT
CUFF TOURNIQUET SINGLE 18IN (TOURNIQUET CUFF) IMPLANT
DECANTER SPIKE VIAL GLASS SM (MISCELLANEOUS) IMPLANT
DRAPE EXTREMITY T 121X128X90 (DRAPE) ×2 IMPLANT
DRAPE SURG 17X23 STRL (DRAPES) ×1 IMPLANT
DRAPE U 20/CS (DRAPES) ×2 IMPLANT
DRAPE U-SHAPE 47X51 STRL (DRAPES) ×1 IMPLANT
DRSG PAD ABDOMINAL 8X10 ST (GAUZE/BANDAGES/DRESSINGS) ×2 IMPLANT
DURAPREP 26ML APPLICATOR (WOUND CARE) ×2 IMPLANT
ELECT REM PT RETURN 9FT ADLT (ELECTROSURGICAL) ×2
ELECTRODE REM PT RTRN 9FT ADLT (ELECTROSURGICAL) ×1 IMPLANT
GAUZE SPONGE 4X4 12PLY STRL (GAUZE/BANDAGES/DRESSINGS) ×2 IMPLANT
GAUZE XEROFORM 1X8 LF (GAUZE/BANDAGES/DRESSINGS) ×1 IMPLANT
GLOVE BIOGEL PI IND STRL 7.0 (GLOVE) ×1 IMPLANT
GLOVE BIOGEL PI IND STRL 8 (GLOVE) ×1 IMPLANT
GLOVE BIOGEL PI INDICATOR 7.0 (GLOVE) ×1
GLOVE BIOGEL PI INDICATOR 8 (GLOVE)
GLOVE ECLIPSE 7.0 STRL STRAW (GLOVE) ×2 IMPLANT
GLOVE SURG ORTHO 8.0 STRL STRW (GLOVE) ×2 IMPLANT
GLOVE SURG SS PI 7.0 STRL IVOR (GLOVE) ×1 IMPLANT
GOWN STRL REUS W/ TWL LRG LVL3 (GOWN DISPOSABLE) ×2 IMPLANT
GOWN STRL REUS W/ TWL XL LVL3 (GOWN DISPOSABLE) ×1 IMPLANT
GOWN STRL REUS W/TWL LRG LVL3 (GOWN DISPOSABLE) ×4
GOWN STRL REUS W/TWL XL LVL3 (GOWN DISPOSABLE) ×2 IMPLANT
K-WIRE .045X4 (WIRE) ×1 IMPLANT
NDL 1/2 CIR CATGUT .05X1.09 (NEEDLE) IMPLANT
NDL HYPO 25X1 1.5 SAFETY (NEEDLE) IMPLANT
NEEDLE 1/2 CIR CATGUT .05X1.09 (NEEDLE) IMPLANT
NEEDLE HYPO 25X1 1.5 SAFETY (NEEDLE) ×2 IMPLANT
NS IRRIG 1000ML POUR BTL (IV SOLUTION) ×2 IMPLANT
PACK BASIN DAY SURGERY FS (CUSTOM PROCEDURE TRAY) ×2 IMPLANT
PAD CAST 4YDX4 CTTN HI CHSV (CAST SUPPLIES) ×3 IMPLANT
PADDING CAST ABS 4INX4YD NS (CAST SUPPLIES) ×1
PADDING CAST ABS COTTON 4X4 ST (CAST SUPPLIES) ×1 IMPLANT
PADDING CAST COTTON 4X4 STRL (CAST SUPPLIES) ×4
PENCIL BUTTON HOLSTER BLD 10FT (ELECTRODE) ×2 IMPLANT
SLEEVE SCD COMPRESS KNEE MED (MISCELLANEOUS) ×1 IMPLANT
SLING ARM FOAM STRAP LRG (SOFTGOODS) IMPLANT
SLING ARM FOAM STRAP XLG (SOFTGOODS) ×1 IMPLANT
SLING ARM MED ADULT FOAM STRAP (SOFTGOODS) IMPLANT
STOCKINETTE 4X48 STRL (DRAPES) ×2 IMPLANT
STRIP CLOSURE SKIN 1/2X4 (GAUZE/BANDAGES/DRESSINGS) ×1 IMPLANT
SUCTION FRAZIER TIP 10 FR DISP (SUCTIONS) ×2 IMPLANT
SUT FIBERWIRE #2 38 T-5 BLUE (SUTURE)
SUT VIC AB 0 CT1 27 (SUTURE) ×2
SUT VIC AB 0 CT1 27XBRD ANBCTR (SUTURE) IMPLANT
SUT VIC AB 2-0 SH 27 (SUTURE) ×2
SUT VIC AB 2-0 SH 27XBRD (SUTURE) IMPLANT
SUT VICRYL 3-0 CR8 SH (SUTURE) ×1 IMPLANT
SUTURE FIBERWR #2 38 T-5 BLUE (SUTURE) ×1 IMPLANT
SYR BULB 3OZ (MISCELLANEOUS) ×2 IMPLANT
SYR CONTROL 10ML LL (SYRINGE) ×1 IMPLANT
TOWEL OR 17X24 6PK STRL BLUE (TOWEL DISPOSABLE) ×2 IMPLANT
TOWEL OR NON WOVEN STRL DISP B (DISPOSABLE) ×1 IMPLANT
TUBE CONNECTING 20X1/4 (TUBING) ×2 IMPLANT
UNDERPAD 30X30 (UNDERPADS AND DIAPERS) ×2 IMPLANT

## 2015-07-21 NOTE — Transfer of Care (Signed)
Immediate Anesthesia Transfer of Care Note  Patient: Stacy Harris  Procedure(s) Performed: Procedure(s): RIGHT ELBOW ARTHROSCOPY WITH DEBRIDEMENT AND  TENDON REPAIR (Right)  Patient Location: PACU  Anesthesia Type:General  Level of Consciousness: awake, sedated and patient cooperative  Airway & Oxygen Therapy: Patient Spontanous Breathing and Patient connected to face mask oxygen  Post-op Assessment: Report given to RN and Post -op Vital signs reviewed and stable  Post vital signs: Reviewed and stable  Last Vitals:  Filed Vitals:   07/21/15 0830  BP: 155/93  Pulse: 87  Temp: 36.9 C  Resp: 18    Complications: No apparent anesthesia complications

## 2015-07-21 NOTE — Progress Notes (Signed)
Assisted Dr. Germeroth with right, ultrasound guided, supraclavicular block. Side rails up, monitors on throughout procedure. See vital signs in flow sheet. Tolerated Procedure well. 

## 2015-07-21 NOTE — Anesthesia Postprocedure Evaluation (Signed)
Anesthesia Post Note  Patient: Stacy Harris  Procedure(s) Performed: Procedure(s) (LRB):  RIGHT ELBOW DEBRIDEMENT AND TENDON REPAIR (Right)  Patient location during evaluation: PACU Anesthesia Type: General Level of consciousness: sedated and patient cooperative Pain management: pain level controlled Vital Signs Assessment: post-procedure vital signs reviewed and stable Respiratory status: spontaneous breathing Cardiovascular status: stable Anesthetic complications: no    Last Vitals:  Filed Vitals:   07/21/15 1130 07/21/15 1145  BP: 123/73 118/79  Pulse: 66 63  Temp:    Resp: 11 12    Last Pain:  Filed Vitals:   07/21/15 1159  PainSc: 7    Pt complaining of pain and requested post op block. I had discussed the block extensively in preop, but Dr. Eulah PontMurphy stated he didn't believe she would benefit. Right supraclavicular block with direct U/S guidance. Bupivacaine 0.5% with epi 30 ml given. Good pain relief.               Lewie LoronJohn Ozan Maclay

## 2015-07-21 NOTE — Op Note (Deleted)
NAMNoreene Larsson:  Stacy Harris, Stacy Harris                  ACCOUNT NO.:  [id number]  MEDICAL RECORD NO.:  [id number]  LOCATION:  SECV                         FACILITY:  [facility]  PHYSICIAN:  Loreta Aveaniel F. Chasya Zenz, M.D. DATE OF BIRTH:  03-12-1966  DATE OF PROCEDURE:  07/21/2015 DATE OF DISCHARGE:  07/15/2015                              OPERATIVE REPORT   POSTOPERATIVE DIAGNOSIS:  Chronic lateral epicondylitis, right elbow, with partial tearing, extensor carpi radialis brevis (ECRB) tendon.  POSTOPERATIVE DIAGNOSIS:  Chronic lateral epicondylitis, right elbow, with partial tearing, extensor carpi radialis brevis (ECRB) tendon.  PROCEDURE: 1. Right elbow exploration. 2. Debridement ECRB tendon lateral capsule. 3. Debridement and drilling of epicondyle laterally. 4. Repair of superficial extensors over the defect with Vicryl.  SURGEON:  Loreta Aveaniel F. Alyus Mofield, M.D.  ASSISTANT:  Mikey KirschnerLindsey Stanberry, PA.  ANESTHESIA:  General.  BLOOD LOSS:  Minimal.  SPECIMENS:  None.  COMPLICATIONS:  None.  DRESSINGS:  Soft compressive with sugar-tong splint.  DESCRIPTION OF PROCEDURE:  The patient was brought to the operating room, placed on the operating table in supine position.  After adequate anesthesia had been obtained, elbow examined.  Full motion stable elbow. Tourniquet applied.  Prepped and draped in usual sterile fashion. Exsanguinated with elevation of Esmarch.  Tourniquet inflated to 250 mmHg.  Longitudinal incision from the epicondyle laterally distally.  A lot of adipose tissue divided and retracted.  Superficial extensors intact.  Divided longitudinally exposing ECRB tendon was partial tearing mucinous degeneration.  All abnormal tissue debrided.  Almost down the level of the radial head.  Joint exposed.  No degenerative change. Little bit of fluid.  No evidence of instability.  Knee irrigated.  Some spurring of the epicondyle debrided to smooth all.  Multiple drilling for later healing.  Wound irrigated.   Superficial extensors closed over the defect with Vicryl for nice repair and reattachment.  Wound irrigated.  Subcutaneous and subcuticular closure.  Margins were injected with Marcaine.  Sterile compressive dressing applied.  Sugar-tong splint was applied. Tourniquet deflated and removed.  Anesthesia reversed.  Brought to the recovery room.  Tolerated the surgery well.  No complications.     Loreta Aveaniel F. Sabiha Sura, M.D.     DFM/MEDQ  D:  07/21/2015  T:  07/21/2015  Job:  [id number]

## 2015-07-21 NOTE — Anesthesia Procedure Notes (Signed)
Procedure Name: LMA Insertion Date/Time: 07/21/2015 10:07 AM Performed by: Gar GibbonKEETON, Keora Eccleston S Pre-anesthesia Checklist: Patient identified, Emergency Drugs available, Suction available and Patient being monitored Patient Re-evaluated:Patient Re-evaluated prior to inductionOxygen Delivery Method: Circle System Utilized Preoxygenation: Pre-oxygenation with 100% oxygen Intubation Type: IV induction Ventilation: Mask ventilation without difficulty LMA: LMA inserted LMA Size: 3.0 Number of attempts: 1 Airway Equipment and Method: Bite block Placement Confirmation: positive ETCO2 Tube secured with: Tape Dental Injury: Teeth and Oropharynx as per pre-operative assessment

## 2015-07-21 NOTE — Anesthesia Preprocedure Evaluation (Addendum)
Anesthesia Evaluation  Patient identified by MRN, date of birth, ID band Patient awake    Reviewed: Allergy & Precautions, H&P , NPO status , Patient's Chart, lab work & pertinent test results  History of Anesthesia Complications (+) PONV and history of anesthetic complications  Airway Mallampati: II  TM Distance: >3 FB Neck ROM: full    Dental no notable dental hx.    Pulmonary neg pulmonary ROS,    Pulmonary exam normal breath sounds clear to auscultation       Cardiovascular hypertension, Pt. on medications Normal cardiovascular exam Rhythm:regular Rate:Normal     Neuro/Psych  Headaches, Seizures -,     GI/Hepatic negative GI ROS, Neg liver ROS,   Endo/Other  obesity  Renal/GU negative Renal ROS     Musculoskeletal  (+) Arthritis ,   Abdominal   Peds  Hematology  (+) anemia ,   Anesthesia Other Findings   Reproductive/Obstetrics negative OB ROS                           Anesthesia Physical  Anesthesia Plan  ASA: II  Anesthesia Plan: General   Post-op Pain Management:    Induction: Intravenous  Airway Management Planned: Oral ETT  Additional Equipment: None  Intra-op Plan:   Post-operative Plan: Extubation in OR  Informed Consent: I have reviewed the patients History and Physical, chart, labs and discussed the procedure including the risks, benefits and alternatives for the proposed anesthesia with the patient or authorized representative who has indicated his/her understanding and acceptance.   Dental advisory given  Plan Discussed with: CRNA  Anesthesia Plan Comments:       Anesthesia Quick Evaluation

## 2015-07-21 NOTE — Interval H&P Note (Signed)
History and Physical Interval Note:  07/21/2015 7:34 AM  Stacy Harris  has presented today for surgery, with the diagnosis of lateral epicondylitis, right elbow  M77.11  The various methods of treatment have been discussed with the patient and family. After consideration of risks, benefits and other options for treatment, the patient has consented to  Procedure(s): RIGHT ELBOW ARTHROSCOPY WITH DEBRIDEMENT AND  TENDON REPAIR (Right) as a surgical intervention .  The patient's history has been reviewed, patient examined, no change in status, stable for surgery.  I have reviewed the patient's chart and labs.  Questions were answered to the patient's satisfaction.     Loreta Aveaniel F [place]

## 2015-07-21 NOTE — Op Note (Signed)
NAMNoreene Larsson:  Stacy Harris, Stacy Harris                  ACCOUNT NO.:  [id number]  MEDICAL RECORD NO.:  [id number]  LOCATION:                               FACILITY:  [facility]  PHYSICIAN:  Loreta Aveaniel F. Jhaniya Briski, M.D. DATE OF BIRTH:  05-16-1966  DATE OF PROCEDURE:  07/21/2015 DATE OF DISCHARGE:  07/21/2015                              OPERATIVE REPORT   POSTOPERATIVE DIAGNOSIS:  Chronic lateral epicondylitis, right elbow, with partial tearing, extensor carpi radialis brevis (ECRB) tendon.  POSTOPERATIVE DIAGNOSIS:  Chronic lateral epicondylitis, right elbow, with partial tearing, extensor carpi radialis brevis (ECRB) tendon.  PROCEDURE: 1. Right elbow exploration. 2. Debridement ECRB tendon lateral capsule. 3. Debridement and drilling of epicondyle laterally. 4. Repair of superficial extensors over the defect with Vicryl.  SURGEON:  Loreta Aveaniel F. Rece Zechman, M.D.  ASSISTANT:  Mikey KirschnerLindsey Stanberry, PA.  ANESTHESIA:  General.  BLOOD LOSS:  Minimal.  SPECIMENS:  None.  COMPLICATIONS:  None.  DRESSINGS:  Soft compressive with sugar-tong splint.  DESCRIPTION OF PROCEDURE:  The patient was brought to the operating room, placed on the operating table in supine position.  After adequate anesthesia had been obtained, elbow examined.  Full motion stable elbow. Tourniquet applied.  Prepped and draped in usual sterile fashion. Exsanguinated with elevation of Esmarch.  Tourniquet inflated to 250 mmHg.  Longitudinal incision from the epicondyle laterally distally.  A lot of adipose tissue divided and retracted.  Superficial extensors intact.  Divided longitudinally exposing ECRB tendon was partial tearing mucinous degeneration.  All abnormal tissue debrided.  Almost down the level of the radial head.  Joint exposed.  No degenerative change. Little bit of fluid.  No evidence of instability.  Knee irrigated.  Some spurring of the epicondyle debrided to smooth all.  Multiple drilling for later healing.  Wound irrigated.   Superficial extensors closed over the defect with Vicryl for nice repair and reattachment.  Wound irrigated.  Subcutaneous and subcuticular closure.  Margins were injected with Marcaine.  Sterile compressive dressing applied.  Sugar-tong splint was applied. Tourniquet deflated and removed.  Anesthesia reversed.  Brought to the recovery room.  Tolerated the surgery well.  No complications.     Loreta Aveaniel F. Astin Rape, M.D.     DFM/MEDQ  D:  07/21/2015  T:  07/21/2015  Job:  [id number]

## 2015-07-21 NOTE — Discharge Instructions (Signed)
Wear sling for comfort.  Do not remove splint.  Do not get splint wet.  Follow up appointment in one week.   SEEK MEDICAL CARE IF: You have swelling of your calf or leg.  You develop shortness of breath or chest pain.  You have redness, swelling, or increasing pain in the wound.  There is pus or any unusual drainage coming from the surgical site.  You notice a bad smell coming from the surgical site or dressing.  The surgical site breaks open after sutures or staples have been removed.  There is persistent bleeding from the suture or staple line.  You are getting worse or are not improving.  You have any other questions or concerns.   SEEK IMMEDIATE MEDICAL CARE IF:  You have a fever greater than 101 You develop a rash.  You have difficulty breathing.  You develop any reaction or side effects to medicines given.  Your knee motion is decreasing rather than improving.   MAKE SURE YOU:  Understand these instructions.  Will watch your condition.  Will get help right away if you are not doing well or get worse.    Regional Anesthesia Blocks  1. Numbness or the inability to move the "blocked" extremity may last from 3-48 hours after placement. The length of time depends on the medication injected and your individual response to the medication. If the numbness is not going away after 48 hours, call your surgeon.  2. The extremity that is blocked will need to be protected until the numbness is gone and the  Strength has returned. Because you cannot feel it, you will need to take extra care to avoid injury. Because it may be weak, you may have difficulty moving it or using it. You may not know what position it is in without looking at it while the block is in effect.  3. For blocks in the legs and feet, returning to weight bearing and walking needs to be done carefully. You will need to wait until the numbness is entirely gone and the strength has returned. You should be able to move your leg  and foot normally before you try and bear weight or walk. You will need someone to be with you when you first try to ensure you do not fall and possibly risk injury.  4. Bruising and tenderness at the needle site are common side effects and will resolve in a few days.  5. Persistent numbness or new problems with movement should be communicated to the surgeon or the [facility] [phone])/ [facility] [phone]).  Post Anesthesia Home Care Instructions  Activity: Get plenty of rest for the remainder of the day. A responsible adult should stay with you for 24 hours following the procedure.  For the next 24 hours, DO NOT: -Drive a car -Advertising copywriterperate machinery -Drink alcoholic beverages -Take any medication unless instructed by your physician -Make any legal decisions or sign important papers.  Meals: Start with liquid foods such as gelatin or soup. Progress to regular foods as tolerated. Avoid greasy, spicy, heavy foods. If nausea and/or vomiting occur, drink only clear liquids until the nausea and/or vomiting subsides. Call your physician if vomiting continues.  Special Instructions/Symptoms: Your throat may feel dry or sore from the anesthesia or the breathing tube placed in your throat during surgery. If this causes discomfort, gargle with warm salt water. The discomfort should disappear within 24 hours.  If you had a scopolamine patch placed behind your  ear for the management of post- operative nausea and/or vomiting:  1. The medication in the patch is effective for 72 hours, after which it should be removed.  Wrap patch in a tissue and discard in the trash. Wash hands thoroughly with soap and water. 2. You may remove the patch earlier than 72 hours if you experience unpleasant side effects which may include dry mouth, dizziness or visual disturbances. 3. Avoid touching the patch. Wash your hands with soap and water after contact with the patch.

## 2015-07-22 ENCOUNTER — Encounter (HOSPITAL_BASED_OUTPATIENT_CLINIC_OR_DEPARTMENT_OTHER): Payer: Self-pay | Admitting: Orthopedic Surgery

## 2015-08-10 ENCOUNTER — Ambulatory Visit (INDEPENDENT_AMBULATORY_CARE_PROVIDER_SITE_OTHER): Payer: 59 | Admitting: Internal Medicine

## 2015-08-10 VITALS — BP 118/92 | HR 80 | Ht 60.0 in | Wt 228.0 lb

## 2015-08-10 DIAGNOSIS — R9431 Abnormal electrocardiogram [ECG] [EKG]: Secondary | ICD-10-CM | POA: Diagnosis not present

## 2015-08-10 DIAGNOSIS — I10 Essential (primary) hypertension: Secondary | ICD-10-CM | POA: Diagnosis not present

## 2015-08-10 NOTE — Patient Instructions (Signed)
Your physician recommends that you schedule a follow-up appointment in: AS NEEDED WITH DR. HILTY  

## 2015-08-11 ENCOUNTER — Encounter: Payer: Self-pay | Admitting: Internal Medicine

## 2015-08-11 NOTE — Progress Notes (Signed)
OFFICE NOTE  Chief Complaint:  Follow-up stress test  Primary Care Physician: Delorse LekBURNETT,BRENT A, MD  HPI:  Andrena Mewsina M Jaso is a pleasant 50 year old female who is currently referred for evaluation of abnormal EKG. Recently she found out her sister had uterine cancer and had some abnormalities on her uterine exam. She then went in for a hysteroscopy and partial D&C. Fortunately there was no evidence for cancer, but preoperatively she had an EKG which was abnormal demonstrating a poor R-wave progression anteriorly and possible anterior infarct. She was then referred to cardiology for evaluation. She cannot recall any prior event that sounded like an acute MI. She does have a history of hypertension although no other known coronary problems. There is no significant coronary disease in her family, more likely cancer. She denies any significant shortness of breath but does get short of breath walking up stairs, which she contributes her recent weight gain. There is some occasional sharp chest discomfort which is not necessarily associated with exertion or relieved by rest. She's not had high cholesterol or diabetes and is a nonsmoker.  Mrs. Marlene BastMason returns today for follow-up. She underwent a exercise treadmill stress test. This was negative for ischemia although she had a hypertensive response to exercise. I suspect her symptoms may be related to weight and lack of exercise. We discussed this at length today and I feel confident that she could start a more active exercise program.  PMHx:  Past Medical History  Diagnosis Date  . Right wrist fracture   . Dysrhythmia     heart racing with hot flashes  . Hypertension   . Varicose veins   . Headache     Migraines  . Epicondylitis elbow, medial   . Arthritis     left knee  . Anemia   . Granular corneal dystrophy   . PONV (postoperative nausea and vomiting)   . Seizures (HCC)     febrile sz at 9 mos    Past Surgical History  Procedure  Laterality Date  . Diagnostic laparoscopy      ovarian cyst removed  . Lymph node dissection  1985  . Dilation and curettage of uterus  1998, 2001  . Varicose vein surgery  1986, 2004, 2007  . Dilatation & curettage/hysteroscopy with myosure N/A 06/06/2015    Procedure: DILATATION & CURETTAGE/HYSTEROSCOPY ;  Surgeon: Marlow Baarsyanna Clark, MD;  Location: [facility];  Service: Gynecology;  Laterality: N/A;  . Wrist arthroscopy w/ triangular fibrocartilage repair Right 06/08/2009  . Tennis elbow release/nirschel procedure Right 07/21/2015    Procedure:  RIGHT ELBOW DEBRIDEMENT AND TENDON REPAIR;  Surgeon: Loreta Aveaniel F Murphy, MD;  Location: [facility];  Service: Orthopedics;  Laterality: Right;    FAMHx:  Family History  Problem Relation Age of Onset  . Kidney disease Father   . Hypertension Father   . Lung cancer Maternal Grandmother   . Hypertension Maternal Grandfather     ? heart disease  . Kidney disease Paternal Grandmother   . Stroke Paternal Grandfather   . Hypertension Sister   . Uterine cancer Sister     SOCHx:   reports that she has never smoked. She has never used smokeless tobacco. She reports that she does not drink alcohol or use illicit drugs.  ALLERGIES:  Allergies  Allergen Reactions  . Codeine Rash    ROS: A comprehensive review of systems was negative.  HOME MEDS: Current Outpatient Prescriptions  Medication Sig Dispense Refill  . aspirin-acetaminophen-caffeine (EXCEDRIN MIGRAINE)  250-250-65 MG tablet Take 2 tablets by mouth every 6 (six) hours as needed for migraine.    . Ferrous Sulfate (IRON) 325 (65 FE) MG TABS Take 1 tablet by mouth daily.    Marland Kitchen HYDROcodone-acetaminophen (NORCO) 10-325 MG tablet Take 1-2 tablets by mouth every 4 (four) hours as needed. 30 tablet 0  . lisinopril (PRINIVIL,ZESTRIL) 10 MG tablet Take 10 mg by mouth daily.    . Multiple Vitamin (MULTIVITAMIN WITH MINERALS) TABS tablet Take 1 tablet by mouth daily.     No current  facility-administered medications for this visit.    LABS/IMAGING: No results found for this or any previous visit (from the past 48 hour(s)). No results found.  WEIGHTS: Wt Readings from Last 3 Encounters:  08/10/15 228 lb (103.42 kg)  07/21/15 229 lb (103.874 kg)  06/16/15 225 lb (102.059 kg)    VITALS: BP 118/92 mmHg  Pulse 80  Ht 5' (1.524 m)  Wt 228 lb (103.42 kg)  BMI 44.53 kg/m2  EXAM: Deferred  EKG: Deferred  ASSESSMENT: 1. Abnormal EKG with poor R-wave progression and transition in lead V5 2. Hypertension-controlled 3. Atypical chest pain  PLAN: 1.   Mrs. Farney had a low risk exercise treadmill stress test. I think a lot of her symptoms may be due to deconditioning and her weight. I've encouraged her to work on exercise and calorie restriction to help with weight loss. Blood pressure appears to be well-controlled. She's had no further symptoms since we last saw her.  Follow-up with me as needed.  Chrystie Nose, MD, [facility] Attending Cardiologist [facility]  Chrystie Nose 08/11/2015, 11:45 AM

## 2016-05-04 ENCOUNTER — Emergency Department (HOSPITAL_COMMUNITY): Payer: 59

## 2016-05-04 ENCOUNTER — Inpatient Hospital Stay (HOSPITAL_COMMUNITY)
Admission: EM | Admit: 2016-05-04 | Discharge: 2016-05-09 | DRG: 872 | Disposition: A | Discharge status: Home / Self Care | Payer: 59 | Attending: Family Medicine | Admitting: Family Medicine

## 2016-05-04 ENCOUNTER — Encounter (HOSPITAL_COMMUNITY): Payer: Self-pay | Admitting: *Deleted

## 2016-05-04 DIAGNOSIS — I10 Essential (primary) hypertension: Secondary | ICD-10-CM | POA: Diagnosis present

## 2016-05-04 DIAGNOSIS — D509 Iron deficiency anemia, unspecified: Secondary | ICD-10-CM | POA: Diagnosis present

## 2016-05-04 DIAGNOSIS — K5720 Diverticulitis of large intestine with perforation and abscess without bleeding: Secondary | ICD-10-CM | POA: Diagnosis present

## 2016-05-04 DIAGNOSIS — Z79899 Other long term (current) drug therapy: Secondary | ICD-10-CM

## 2016-05-04 DIAGNOSIS — A419 Sepsis, unspecified organism: Secondary | ICD-10-CM | POA: Diagnosis not present

## 2016-05-04 DIAGNOSIS — R509 Fever, unspecified: Secondary | ICD-10-CM | POA: Diagnosis not present

## 2016-05-04 LAB — URINALYSIS, ROUTINE W REFLEX MICROSCOPIC
Bilirubin Urine: NEGATIVE
Glucose, UA: NEGATIVE mg/dL
KETONES UR: 15 mg/dL — AB
LEUKOCYTES UA: NEGATIVE
NITRITE: NEGATIVE
PH: 6.5 (ref 5.0–8.0)
Protein, ur: NEGATIVE mg/dL
SPECIFIC GRAVITY, URINE: 1.025 (ref 1.005–1.030)

## 2016-05-04 LAB — CBC
HEMATOCRIT: 37.3 % (ref 36.0–46.0)
HEMOGLOBIN: 11.4 g/dL — AB (ref 12.0–15.0)
MCH: 26.1 pg (ref 26.0–34.0)
MCHC: 30.6 g/dL (ref 30.0–36.0)
MCV: 85.4 fL (ref 78.0–100.0)
Platelets: 233 10*3/uL (ref 150–400)
RBC: 4.37 MIL/uL (ref 3.87–5.11)
RDW: 14.5 % (ref 11.5–15.5)
WBC: 14.4 10*3/uL — AB (ref 4.0–10.5)

## 2016-05-04 LAB — COMPREHENSIVE METABOLIC PANEL
ALBUMIN: 4.1 g/dL (ref 3.5–5.0)
ALT: 14 U/L (ref 14–54)
ANION GAP: 10 (ref 5–15)
AST: 16 U/L (ref 15–41)
Alkaline Phosphatase: 81 U/L (ref 38–126)
BUN: 8 mg/dL (ref 6–20)
CO2: 23 mmol/L (ref 22–32)
Calcium: 8.7 mg/dL — ABNORMAL LOW (ref 8.9–10.3)
Chloride: 104 mmol/L (ref 101–111)
Creatinine, Ser: 0.77 mg/dL (ref 0.44–1.00)
Glucose, Bld: 126 mg/dL — ABNORMAL HIGH (ref 65–99)
POTASSIUM: 3.1 mmol/L — AB (ref 3.5–5.1)
SODIUM: 137 mmol/L (ref 135–145)
TOTAL PROTEIN: 6.7 g/dL (ref 6.5–8.1)
Total Bilirubin: 0.4 mg/dL (ref 0.3–1.2)

## 2016-05-04 LAB — I-STAT BETA HCG BLOOD, ED (MC, WL, AP ONLY)

## 2016-05-04 LAB — URINE MICROSCOPIC-ADD ON

## 2016-05-04 LAB — LIPASE, BLOOD: Lipase: 27 U/L (ref 11–51)

## 2016-05-04 MED ORDER — FENTANYL CITRATE (PF) 100 MCG/2ML IJ SOLN
INTRAMUSCULAR | Status: AC
  Filled 2016-05-04: qty 2

## 2016-05-04 MED ORDER — ACETAMINOPHEN 325 MG PO TABS
ORAL_TABLET | ORAL | Status: AC
  Filled 2016-05-04: qty 2

## 2016-05-04 MED ORDER — FENTANYL CITRATE (PF) 100 MCG/2ML IJ SOLN
50.0000 ug | INTRAMUSCULAR | Status: DC | PRN
  Administered 2016-05-04: 50 ug via INTRAVENOUS

## 2016-05-04 MED ORDER — SODIUM CHLORIDE 0.9 % IV BOLUS (SEPSIS)
1000.0000 mL | Freq: Once | INTRAVENOUS | Status: AC
  Administered 2016-05-04: 1000 mL via INTRAVENOUS

## 2016-05-04 MED ORDER — ACETAMINOPHEN 325 MG PO TABS
650.0000 mg | ORAL_TABLET | Freq: Once | ORAL | Status: AC
  Administered 2016-05-04: 650 mg via ORAL

## 2016-05-04 MED ORDER — SODIUM CHLORIDE 0.9 % IV SOLN: INTRAVENOUS | Status: DC

## 2016-05-04 MED ORDER — IOPAMIDOL (ISOVUE-300) INJECTION 61%
INTRAVENOUS | Status: AC
  Administered 2016-05-04: 100 mL
  Filled 2016-05-04: qty 100

## 2016-05-04 MED ORDER — ONDANSETRON HCL 4 MG/2ML IJ SOLN
INTRAMUSCULAR | Status: AC
  Filled 2016-05-04: qty 2

## 2016-05-04 MED ORDER — HYDROMORPHONE HCL 1 MG/ML IJ SOLN
1.0000 mg | Freq: Once | INTRAMUSCULAR | Status: AC
  Administered 2016-05-05: 1 mg via INTRAVENOUS
  Filled 2016-05-04: qty 1

## 2016-05-04 MED ORDER — ONDANSETRON HCL 4 MG/2ML IJ SOLN
4.0000 mg | Freq: Once | INTRAMUSCULAR | Status: AC | PRN
  Administered 2016-05-04: 4 mg via INTRAVENOUS

## 2016-05-04 MED ORDER — PIPERACILLIN-TAZOBACTAM 3.375 G IVPB 30 MIN
3.3750 g | Freq: Once | INTRAVENOUS | Status: AC
  Administered 2016-05-05: 3.375 g via INTRAVENOUS
  Filled 2016-05-04: qty 50

## 2016-05-04 MED ORDER — ONDANSETRON HCL 4 MG/2ML IJ SOLN
4.0000 mg | Freq: Once | INTRAMUSCULAR | Status: AC
  Administered 2016-05-05: 4 mg via INTRAVENOUS
  Filled 2016-05-04: qty 2

## 2016-05-04 NOTE — ED Triage Notes (Signed)
Pt reports onset this afternoon of severe lower abd pain and lower back pain. Denies urinary symptoms. Having nausea and is tearful at triage.

## 2016-05-04 NOTE — ED Provider Notes (Addendum)
[facility]-EMERGENCY DEPT Provider Note   CSN: [id number] Arrival date & time: 05/04/16  1631     History   Chief Complaint Chief Complaint  Patient presents with  . Abdominal Pain    HPI Stacy Harris is a 50 y.o. female.  Patient is a 50 year old female with a history of hypertension, dysrhythmia, anemia who presented today with sudden onset of severe pain in the lower abdomen radiating into the right flank that started spontaneously at 4 PM. She states she felt her normal self all day long until then. She denies any trauma. She was not doing anything in particular when the pain started. It has not let up since it started at 4 PM unless she receives IV pain medication. He has never experienced any pain like this before   Abdominal Pain   This is a new problem. The current episode started 3 to 5 hours ago. The problem occurs constantly. The problem has not changed since onset.Associated with: started spontaneously at 4pm today. The pain is located in the suprapubic region (radiates to the right back). The quality of the pain is sharp and shooting. The pain is at a severity of 10/10. The pain is severe. Associated symptoms include anorexia, fever, nausea and vomiting. Pertinent negatives include diarrhea, dysuria, frequency, hematuria and headaches. The symptoms are aggravated by activity. Relieved by: pt given fentanyl here with some improvement. Past medical history comments: hx of ruptured ovarian cysts years ago but nothing recent.    Past Medical History:  Diagnosis Date  . Anemia   . Arthritis    left knee  . Dysrhythmia    heart racing with hot flashes  . Epicondylitis elbow, medial   . Granular corneal dystrophy   . Headache    Migraines  . Hypertension   . PONV (postoperative nausea and vomiting)   . Right wrist fracture   . Seizures ([facility])    febrile sz at 9 mos  . Varicose veins     Patient Active Problem List   Diagnosis Date Noted  . Abnormal EKG 06/16/2015  .  Essential hypertension 06/16/2015    Past Surgical History:  Procedure Laterality Date  . DIAGNOSTIC LAPAROSCOPY     ovarian cyst removed  . DILATATION & CURETTAGE/HYSTEROSCOPY WITH MYOSURE N/A 06/06/2015   Procedure: DILATATION & CURETTAGE/HYSTEROSCOPY ;  Surgeon: Marlow Baars, MD;  Location: [facility];  Service: Gynecology;  Laterality: N/A;  . DILATION AND CURETTAGE OF UTERUS  1998, 2001  . LYMPH NODE DISSECTION  1985  . TENNIS ELBOW RELEASE/NIRSCHEL PROCEDURE Right 07/21/2015   Procedure:  RIGHT ELBOW DEBRIDEMENT AND TENDON REPAIR;  Surgeon: Loreta Ave, MD;  Location: [facility];  Service: Orthopedics;  Laterality: Right;  Marland Kitchen VARICOSE VEIN SURGERY  1986, 2004, 2007  . WRIST ARTHROSCOPY W/ TRIANGULAR FIBROCARTILAGE REPAIR Right 06/08/2009    OB History    No data available       Home Medications    Prior to Admission medications   Medication Sig Start Date End Date Taking? Authorizing Provider  aspirin-acetaminophen-caffeine (EXCEDRIN MIGRAINE) [phone] MG tablet Take 2 tablets by mouth every 6 (six) hours as needed for migraine.    Historical Provider, MD  Ferrous Sulfate (IRON) 325 (65 FE) MG TABS Take 1 tablet by mouth daily.    Historical Provider, MD  HYDROcodone-acetaminophen (NORCO) 10-325 MG tablet Take 1-2 tablets by mouth every 4 (four) hours as needed. 07/21/15   Cristie Hem, PA-C  lisinopril (PRINIVIL,ZESTRIL) 10 MG tablet  Take 10 mg by mouth daily.    Historical Provider, MD  Multiple Vitamin (MULTIVITAMIN WITH MINERALS) TABS tablet Take 1 tablet by mouth daily.    Historical Provider, MD    Family History Family History  Problem Relation Age of Onset  . Kidney disease Father   . Hypertension Father   . Lung cancer Maternal Grandmother   . Hypertension Maternal Grandfather     ? heart disease  . Kidney disease Paternal Grandmother   . Stroke Paternal Grandfather   . Hypertension Sister   . Uterine cancer Sister     Social  History Social History  Substance Use Topics  . Smoking status: Never Smoker  . Smokeless tobacco: Never Used  . Alcohol use No     Allergies   Codeine   Review of Systems Review of Systems  Constitutional: Positive for fever.  Gastrointestinal: Positive for abdominal pain, anorexia, nausea and vomiting. Negative for diarrhea.  Genitourinary: Negative for dysuria, frequency and hematuria.       No vaginal discharge and states only some mild spotting and she is coming off of her typical menses  Neurological: Negative for headaches.  All other systems reviewed and are negative.    Physical Exam Updated Vital Signs BP 134/63 (BP Location: Right Arm)   Pulse 100   Temp 101.3 F (38.5 C) (Oral)   Resp 16   SpO2 100%   Physical Exam  Constitutional: She is oriented to person, place, and time. She appears well-developed and well-nourished. She appears distressed.  Appears to be in pain  HENT:  Head: Normocephalic and atraumatic.  Mouth/Throat: Oropharynx is clear and moist.  Eyes: Conjunctivae and EOM are normal. Pupils are equal, round, and reactive to light.  Neck: Normal range of motion. Neck supple.  Cardiovascular: Normal rate, regular rhythm and intact distal pulses.   No murmur heard. Pulmonary/Chest: Effort normal and breath sounds normal. No respiratory distress. She has no wheezes. She has no rales.  Abdominal: Soft. She exhibits no distension. There is tenderness in the right upper quadrant, right lower quadrant and suprapubic area. There is rebound, guarding and CVA tenderness.  Mild tenderness in the left lower quadrant but more significant suprapubic and right lower quadrant  Musculoskeletal: Normal range of motion. She exhibits no edema or tenderness.  Neurological: She is alert and oriented to person, place, and time.  Skin: Skin is warm and dry. No rash noted. No erythema.  Psychiatric: She has a normal mood and affect. Her behavior is normal.  Nursing note  and vitals reviewed.    ED Treatments / Results  Labs (all labs ordered are listed, but only abnormal results are displayed) Labs Reviewed  COMPREHENSIVE METABOLIC PANEL - Abnormal; Notable for the following:       Result Value   Potassium 3.1 (*)    Glucose, Bld 126 (*)    Calcium 8.7 (*)    All other components within normal limits  CBC - Abnormal; Notable for the following:    WBC 14.4 (*)    Hemoglobin 11.4 (*)    All other components within normal limits  URINALYSIS, ROUTINE W REFLEX MICROSCOPIC (NOT AT [facility]) - Abnormal; Notable for the following:    Hgb urine dipstick TRACE (*)    Ketones, ur 15 (*)    All other components within normal limits  URINE MICROSCOPIC-ADD ON - Abnormal; Notable for the following:    Squamous Epithelial / LPF 0-5 (*)    Bacteria, UA FEW (*)  All other components within normal limits  LIPASE, BLOOD    EKG  EKG Interpretation None       Radiology Ct Abdomen Pelvis W Contrast  Result Date: 05/04/2016 CLINICAL DATA:  Lower abdominal and back pain with sudden onset 10 hours ago. EXAM: CT ABDOMEN AND PELVIS WITH CONTRAST TECHNIQUE: Multidetector CT imaging of the abdomen and pelvis was performed using the standard protocol following bolus administration of intravenous contrast. CONTRAST:  ISOVUE-300 IOPAMIDOL (ISOVUE-300) INJECTION 61% COMPARISON:  None. FINDINGS: Lower chest: No acute abnormality.  Hypoventilatory changes. Hepatobiliary: No focal liver abnormality is seen. No gallstones, gallbladder wall thickening, or biliary dilatation. Pancreas: Unremarkable. No pancreatic ductal dilatation or surrounding inflammatory changes. Spleen: Normal in size without focal abnormality. Adrenals/Urinary Tract: Normal adrenal glands. No evidence of obstructive uropathy or nephrolithiasis. Bilateral too small to be accurately characterized hypoattenuated lesions, which statistically likely represent cysts. Stomach/Bowel: Stomach is within normal  limits. Appendix appears normal. There is a short segment of irregular mucosal thickening of the distal sigmoid colon/proximal rectum with extensive pericolonic mesenteric stranding. Few small diverticula are seen in the area. Several tiny extraluminal gas bubbles are also present in the nearby mesentery. Smooth symmetric mucosal thickening is seen of the downstream rectum. Vascular/Lymphatic: No significant vascular findings are present. No enlarged abdominal or pelvic lymph nodes. Reproductive: Uterus and bilateral adnexa are unremarkable. Other: No abdominal wall hernia or abnormality. Small amount of free fluid in the pelvis, likely inflammatory. Musculoskeletal: No acute or significant osseous findings. IMPRESSION: Abnormal appearance of the distal sigmoid colon/ proximal rectum with area of focal asymmetric mucosal thickening and pericolonic inflammatory changes. This may represent a focal acute diverticulitis. Few extraluminal gas bubbles in the nearby mesentery suggest micro rupture. Small amount of free pelvic fluid. No evidence of drainable abscess formation. Long segment of smooth mucosal thickening of the rectum, likely inflammatory. Correlation to colonoscopy after resolution of patient's acute symptoms is recommended to exclude underlying malignancy. Electronically Signed   By: Ted Mcalpine M.D.   On: 05/04/2016 23:23    Procedures Procedures (including critical care time)  Medications Ordered in ED Medications  fentaNYL (SUBLIMAZE) injection 50 mcg (50 mcg Intravenous Given 05/04/16 1654)  ondansetron (ZOFRAN) 4 MG/2ML injection (not administered)  fentaNYL (SUBLIMAZE) 100 MCG/2ML injection (not administered)  fentaNYL (SUBLIMAZE) 100 MCG/2ML injection (not administered)  acetaminophen (TYLENOL) 325 MG tablet (not administered)  sodium chloride 0.9 % bolus 1,000 mL (not administered)  ondansetron (ZOFRAN) injection 4 mg (4 mg Intravenous Given 05/04/16 1653)  acetaminophen  (TYLENOL) tablet 650 mg (650 mg Oral Given 05/04/16 1931)     Initial Impression / Assessment and Plan / ED Course  I have reviewed the triage vital signs and the nursing notes.  Pertinent labs & imaging results that were available during my care of the patient were reviewed by me and considered in my medical decision making (see chart for details).  Clinical Course   Patient is a 50 year old female was sudden onset abdominal pain that started at 4 PM today. Patient's pain is in the lower abdomen more localized to the suprapubic and right lower quadrant radiating into her back. It is worse with movement and has caused vomiting. She also developed a fever of 101.3 while here. As any pain with urination or bloody urine. No prior history of kidney stones. She has had a prior history of ovarian cysts but it has been years. Patient has significant pain on exam with right lower quadrant pain being the most severe  with rebound and guarding. Labs within normal limits except for leukocytosis of 14,000. Urine with trace blood the patient gave a clean catch and her menses has not completely resolved. No sign of UTI. Concern for potential perforated viscus versus appendicitis versus kidney stone versus ovarian torsion or ruptured ovarian cyst. Lower suspicion for pyelonephritis.  Patient has received multiple rounds of fentanyl in the waiting room. Currently her pain is tolerable. She was given IV fluids. CT of the abdomen and pelvis pending  11:39 PM CT is consistent with acute diverticulitis with microperforation. She qualifies for high risk diverticulitis given she has a fever of 101.3, white blood cell count of 14,000 and severe tenderness with palpation. She was started on Zosyn. Will notify general surgery. Currently is nothing by mouth.  12:23 AM Dr. Lindie SpruceWyatt with surgery consulted Final Clinical Impressions(s) / ED Diagnoses   Final diagnoses:  Diverticulitis of large intestine with perforation  without abscess or bleeding    New Prescriptions New Prescriptions   No medications on file     Gwyneth SproutWhitney Yanelly Cantrelle, MD 05/04/16 [id number]    Gwyneth SproutWhitney Reyan Helle, MD 05/05/16 [id number]

## 2016-05-04 NOTE — ED Notes (Signed)
Pt informed of wait times. 

## 2016-05-04 NOTE — ED Notes (Signed)
Dr. Plunkett at bedside at this time.  

## 2016-05-04 NOTE — ED Notes (Signed)
Patient transported to CT 

## 2016-05-05 ENCOUNTER — Inpatient Hospital Stay (HOSPITAL_COMMUNITY): Payer: 59

## 2016-05-05 DIAGNOSIS — A419 Sepsis, unspecified organism: Secondary | ICD-10-CM | POA: Diagnosis present

## 2016-05-05 DIAGNOSIS — I10 Essential (primary) hypertension: Secondary | ICD-10-CM | POA: Diagnosis present

## 2016-05-05 DIAGNOSIS — R509 Fever, unspecified: Secondary | ICD-10-CM | POA: Diagnosis present

## 2016-05-05 DIAGNOSIS — D509 Iron deficiency anemia, unspecified: Secondary | ICD-10-CM | POA: Diagnosis present

## 2016-05-05 DIAGNOSIS — Z79899 Other long term (current) drug therapy: Secondary | ICD-10-CM | POA: Diagnosis not present

## 2016-05-05 DIAGNOSIS — K5720 Diverticulitis of large intestine with perforation and abscess without bleeding: Secondary | ICD-10-CM | POA: Diagnosis present

## 2016-05-05 LAB — BASIC METABOLIC PANEL
Anion gap: 8 (ref 5–15)
BUN: 7 mg/dL (ref 6–20)
CALCIUM: 7.8 mg/dL — AB (ref 8.9–10.3)
CHLORIDE: 105 mmol/L (ref 101–111)
CO2: 23 mmol/L (ref 22–32)
CREATININE: 0.74 mg/dL (ref 0.44–1.00)
Glucose, Bld: 139 mg/dL — ABNORMAL HIGH (ref 65–99)
Potassium: 4.1 mmol/L (ref 3.5–5.1)
SODIUM: 136 mmol/L (ref 135–145)

## 2016-05-05 LAB — CBC WITH DIFFERENTIAL/PLATELET
Basophils Absolute: 0 10*3/uL (ref 0.0–0.1)
Basophils Relative: 0 %
EOS ABS: 0 10*3/uL (ref 0.0–0.7)
EOS PCT: 0 %
HCT: 35.4 % — ABNORMAL LOW (ref 36.0–46.0)
Hemoglobin: 10.6 g/dL — ABNORMAL LOW (ref 12.0–15.0)
LYMPHS ABS: 0.7 10*3/uL (ref 0.7–4.0)
Lymphocytes Relative: 5 %
MCH: 25.6 pg — AB (ref 26.0–34.0)
MCHC: 29.9 g/dL — ABNORMAL LOW (ref 30.0–36.0)
MCV: 85.5 fL (ref 78.0–100.0)
MONO ABS: 0.8 10*3/uL (ref 0.1–1.0)
Monocytes Relative: 5 %
Neutro Abs: 13.9 10*3/uL — ABNORMAL HIGH (ref 1.7–7.7)
Neutrophils Relative %: 90 %
PLATELETS: 217 10*3/uL (ref 150–400)
RBC: 4.14 MIL/uL (ref 3.87–5.11)
RDW: 14.7 % (ref 11.5–15.5)
WBC: 15.4 10*3/uL — AB (ref 4.0–10.5)

## 2016-05-05 LAB — CBC
HCT: 32.8 % — ABNORMAL LOW (ref 36.0–46.0)
Hemoglobin: 9.8 g/dL — ABNORMAL LOW (ref 12.0–15.0)
MCH: 25.7 pg — ABNORMAL LOW (ref 26.0–34.0)
MCHC: 29.9 g/dL — ABNORMAL LOW (ref 30.0–36.0)
MCV: 85.9 fL (ref 78.0–100.0)
PLATELETS: 209 10*3/uL (ref 150–400)
RBC: 3.82 MIL/uL — AB (ref 3.87–5.11)
RDW: 14.8 % (ref 11.5–15.5)
WBC: 14.7 10*3/uL — AB (ref 4.0–10.5)

## 2016-05-05 LAB — TYPE AND SCREEN
ABO/RH(D): O POS
ANTIBODY SCREEN: NEGATIVE

## 2016-05-05 LAB — PROTIME-INR
INR: 1.11
PROTHROMBIN TIME: 14.4 s (ref 11.4–15.2)

## 2016-05-05 LAB — MAGNESIUM: MAGNESIUM: 1.5 mg/dL — AB (ref 1.7–2.4)

## 2016-05-05 LAB — LACTIC ACID, PLASMA
LACTIC ACID, VENOUS: 2.3 mmol/L — AB (ref 0.5–1.9)
Lactic Acid, Venous: 1.9 mmol/L (ref 0.5–1.9)

## 2016-05-05 LAB — I-STAT CG4 LACTIC ACID, ED: LACTIC ACID, VENOUS: 2.35 mmol/L — AB (ref 0.5–1.9)

## 2016-05-05 LAB — GLUCOSE, CAPILLARY: Glucose-Capillary: 118 mg/dL — ABNORMAL HIGH (ref 65–99)

## 2016-05-05 LAB — PROCALCITONIN: Procalcitonin: 0.4 ng/mL

## 2016-05-05 LAB — ABO/RH: ABO/RH(D): O POS

## 2016-05-05 LAB — APTT: aPTT: 36 seconds (ref 24–36)

## 2016-05-05 MED ORDER — SODIUM CHLORIDE 0.9 % IV SOLN: INTRAVENOUS | Status: DC

## 2016-05-05 MED ORDER — KCL IN DEXTROSE-NACL 20-5-0.45 MEQ/L-%-% IV SOLN
INTRAVENOUS | Status: DC
  Administered 2016-05-05 – 2016-05-07 (×3): via INTRAVENOUS
  Filled 2016-05-05 (×5): qty 1000

## 2016-05-05 MED ORDER — ACETAMINOPHEN 650 MG RE SUPP: 650.0000 mg | Freq: Four times a day (QID) | RECTAL | Status: DC | PRN

## 2016-05-05 MED ORDER — MAGNESIUM SULFATE 2 GM/50ML IV SOLN
2.0000 g | Freq: Once | INTRAVENOUS | Status: AC
  Administered 2016-05-05: 2 g via INTRAVENOUS
  Filled 2016-05-05: qty 50

## 2016-05-05 MED ORDER — SODIUM CHLORIDE 0.9 % IV BOLUS (SEPSIS)
1000.0000 mL | Freq: Once | INTRAVENOUS | Status: AC
  Administered 2016-05-05: 1000 mL via INTRAVENOUS

## 2016-05-05 MED ORDER — SODIUM CHLORIDE 0.9 % IV SOLN
INTRAVENOUS | Status: DC
  Administered 2016-05-05: 03:00:00 via INTRAVENOUS

## 2016-05-05 MED ORDER — ZOLPIDEM TARTRATE 5 MG PO TABS
5.0000 mg | ORAL_TABLET | Freq: Every evening | ORAL | Status: DC | PRN
  Filled 2016-05-05: qty 1

## 2016-05-05 MED ORDER — HYDRALAZINE HCL 20 MG/ML IJ SOLN: 5.0000 mg | INTRAMUSCULAR | Status: DC | PRN

## 2016-05-05 MED ORDER — PIPERACILLIN-TAZOBACTAM 3.375 G IVPB
3.3750 g | Freq: Three times a day (TID) | INTRAVENOUS | Status: DC
  Administered 2016-05-05 – 2016-05-09 (×13): 3.375 g via INTRAVENOUS
  Filled 2016-05-05 (×15): qty 50

## 2016-05-05 MED ORDER — HYDROMORPHONE HCL 1 MG/ML IJ SOLN
1.0000 mg | INTRAMUSCULAR | Status: DC | PRN
  Administered 2016-05-05 (×4): 1 mg via INTRAVENOUS
  Filled 2016-05-05 (×4): qty 1

## 2016-05-05 MED ORDER — ACETAMINOPHEN 325 MG PO TABS
650.0000 mg | ORAL_TABLET | Freq: Four times a day (QID) | ORAL | Status: DC | PRN
  Administered 2016-05-05 – 2016-05-07 (×6): 650 mg via ORAL
  Filled 2016-05-05 (×7): qty 2

## 2016-05-05 MED ORDER — LIP MEDEX EX OINT
TOPICAL_OINTMENT | CUTANEOUS | Status: DC | PRN
  Filled 2016-05-05: qty 7

## 2016-05-05 MED ORDER — ONDANSETRON HCL 4 MG/2ML IJ SOLN
4.0000 mg | Freq: Three times a day (TID) | INTRAMUSCULAR | Status: AC | PRN
  Administered 2016-05-07: 4 mg via INTRAVENOUS
  Filled 2016-05-05: qty 2

## 2016-05-05 MED ORDER — ENOXAPARIN SODIUM 40 MG/0.4ML ~~LOC~~ SOLN
40.0000 mg | Freq: Every day | SUBCUTANEOUS | Status: DC
  Administered 2016-05-07: 40 mg via SUBCUTANEOUS
  Filled 2016-05-05: qty 0.4

## 2016-05-05 MED ORDER — BLISTEX MEDICATED EX OINT
TOPICAL_OINTMENT | CUTANEOUS | Status: DC | PRN
  Filled 2016-05-05: qty 6.3

## 2016-05-05 MED ORDER — POTASSIUM CHLORIDE 10 MEQ/100ML IV SOLN
10.0000 meq | INTRAVENOUS | Status: AC
  Administered 2016-05-05 (×2): 10 meq via INTRAVENOUS
  Filled 2016-05-05 (×2): qty 100

## 2016-05-05 MED ORDER — INFLUENZA VAC SPLIT QUAD 0.5 ML IM SUSY: 0.5000 mL | PREFILLED_SYRINGE | INTRAMUSCULAR | Status: DC | PRN

## 2016-05-05 NOTE — ED Notes (Signed)
Phlebotomy at bedside at this time.

## 2016-05-05 NOTE — ED Notes (Signed)
Loistine Chancehilip, spouse [phone]

## 2016-05-05 NOTE — Consult Note (Signed)
Reason for Consult:Abdominal pain and diverticular perforation Referring Physician: Brittaney Beaulieu is an 50 y.o. female.  HPI: Sudden onset of severe lower abdominal pain associated with nausea and vomiting  Past Medical History:  Diagnosis Date  . Anemia   . Arthritis    left knee  . Dysrhythmia    heart racing with hot flashes  . Epicondylitis elbow, medial   . Granular corneal dystrophy   . Headache    Migraines  . Hypertension   . PONV (postoperative nausea and vomiting)   . Right wrist fracture   . Seizures ([place])    febrile sz at 9 mos  . Varicose veins     Past Surgical History:  Procedure Laterality Date  . DIAGNOSTIC LAPAROSCOPY     ovarian cyst removed  . DILATATION & CURETTAGE/HYSTEROSCOPY WITH MYOSURE N/A 06/06/2015   Procedure: DILATATION & CURETTAGE/HYSTEROSCOPY ;  Surgeon: Jerelyn Charles, MD;  Location: [facility];  Service: Gynecology;  Laterality: N/A;  . [place], 2001  . LYMPH NODE DISSECTION  1985  . TENNIS ELBOW RELEASE/NIRSCHEL PROCEDURE Right 07/21/2015   Procedure:  RIGHT ELBOW DEBRIDEMENT AND TENDON REPAIR;  Surgeon: Ninetta Lights, MD;  Location: [facility];  Service: Orthopedics;  Laterality: Right;  Marland Kitchen [place], 2004, 2007  . WRIST ARTHROSCOPY W/ TRIANGULAR FIBROCARTILAGE REPAIR Right 06/08/2009    Family History  Problem Relation Age of Onset  . Kidney disease Father   . Hypertension Father   . Lung cancer Maternal Grandmother   . Hypertension Maternal Grandfather     ? heart disease  . Kidney disease Paternal Grandmother   . Stroke Paternal Grandfather   . Hypertension Sister   . Uterine cancer Sister     Social History:  reports that she has never smoked. She has never used smokeless tobacco. She reports that she does not drink alcohol or use drugs.  Allergies:  Allergies  Allergen Reactions  . Codeine Rash    Medications: I have reviewed the patient's current  medications.  Results for orders placed or performed during the hospital encounter of 05/04/16 (from the past 48 hour(s))  Lipase, blood     Status: None   Collection Time: 05/04/16  4:46 PM  Result Value Ref Range   Lipase 27 11 - 51 U/L  Comprehensive metabolic panel     Status: Abnormal   Collection Time: 05/04/16  4:46 PM  Result Value Ref Range   Sodium 137 135 - 145 mmol/L   Potassium 3.1 (L) 3.5 - 5.1 mmol/L   Chloride 104 101 - 111 mmol/L   CO2 23 22 - 32 mmol/L   Glucose, Bld 126 (H) 65 - 99 mg/dL   BUN 8 6 - 20 mg/dL   Creatinine, Ser 0.77 0.44 - 1.00 mg/dL   Calcium 8.7 (L) 8.9 - 10.3 mg/dL   Total Protein 6.7 6.5 - 8.1 g/dL   Albumin 4.1 3.5 - 5.0 g/dL   AST 16 15 - 41 U/L   ALT 14 14 - 54 U/L   Alkaline Phosphatase 81 38 - 126 U/L   Total Bilirubin 0.4 0.3 - 1.2 mg/dL   GFR calc non Af Amer >60 >60 mL/min   GFR calc Af Amer >60 >60 mL/min    Comment: (NOTE) The eGFR has been calculated using the CKD EPI equation. This calculation has not been validated in all clinical situations. eGFR's persistently <60 mL/min signify possible Chronic  Kidney Disease.    Anion gap 10 5 - 15  CBC     Status: Abnormal   Collection Time: 05/04/16  4:46 PM  Result Value Ref Range   WBC 14.4 (H) 4.0 - 10.5 K/uL   RBC 4.37 3.87 - 5.11 MIL/uL   Hemoglobin 11.4 (L) 12.0 - 15.0 g/dL   HCT 37.3 36.0 - 46.0 %   MCV 85.4 78.0 - 100.0 fL   MCH 26.1 26.0 - 34.0 pg   MCHC 30.6 30.0 - 36.0 g/dL   RDW 14.5 11.5 - 15.5 %   Platelets 233 150 - 400 K/uL  Urinalysis, Routine w reflex microscopic     Status: Abnormal   Collection Time: 05/04/16  7:48 PM  Result Value Ref Range   Color, Urine YELLOW YELLOW   APPearance CLEAR CLEAR   Specific Gravity, Urine 1.025 1.005 - 1.030   pH 6.5 5.0 - 8.0   Glucose, UA NEGATIVE NEGATIVE mg/dL   Hgb urine dipstick TRACE (A) NEGATIVE   Bilirubin Urine NEGATIVE NEGATIVE   Ketones, ur 15 (A) NEGATIVE mg/dL   Protein, ur NEGATIVE NEGATIVE mg/dL    Nitrite NEGATIVE NEGATIVE   Leukocytes, UA NEGATIVE NEGATIVE  Urine microscopic-add on     Status: Abnormal   Collection Time: 05/04/16  7:48 PM  Result Value Ref Range   Squamous Epithelial / LPF 0-5 (A) NONE SEEN   WBC, UA 0-5 0 - 5 WBC/hpf   RBC / HPF 0-5 0 - 5 RBC/hpf   Bacteria, UA FEW (A) NONE SEEN   Urine-Other MUCOUS PRESENT   I-Stat Beta hCG blood, ED ([facility], [facility], [facility] only)     Status: None   Collection Time: 05/04/16  9:39 PM  Result Value Ref Range   I-stat hCG, quantitative <5.0 <5 mIU/mL   Comment 3            Comment:   GEST. AGE      CONC.  (mIU/mL)   <=1 WEEK        5 - 50     2 WEEKS       50 - 500     3 WEEKS       100 - 10,000     4 WEEKS     1,000 - 30,000        FEMALE AND NON-PREGNANT FEMALE:     LESS THAN 5 mIU/mL     Ct Abdomen Pelvis W Contrast  Result Date: 05/04/2016 CLINICAL DATA:  Lower abdominal and back pain with sudden onset 10 hours ago. EXAM: CT ABDOMEN AND PELVIS WITH CONTRAST TECHNIQUE: Multidetector CT imaging of the abdomen and pelvis was performed using the standard protocol following bolus administration of intravenous contrast. CONTRAST:  19m ISOVUE-300 IOPAMIDOL (ISOVUE-300) INJECTION 61% COMPARISON:  None. FINDINGS: Lower chest: No acute abnormality.  Hypoventilatory changes. Hepatobiliary: No focal liver abnormality is seen. No gallstones, gallbladder wall thickening, or biliary dilatation. Pancreas: Unremarkable. No pancreatic ductal dilatation or surrounding inflammatory changes. Spleen: Normal in size without focal abnormality. Adrenals/Urinary Tract: Normal adrenal glands. No evidence of obstructive uropathy or nephrolithiasis. Bilateral too small to be accurately characterized hypoattenuated lesions, which statistically likely represent cysts. Stomach/Bowel: Stomach is within normal limits. Appendix appears normal. There is a short segment of irregular mucosal thickening of the distal sigmoid colon/proximal rectum with extensive pericolonic  mesenteric stranding. Few small diverticula are seen in the area. Several tiny extraluminal gas bubbles are also present in the nearby mesentery. Smooth symmetric mucosal thickening  is seen of the downstream rectum. Vascular/Lymphatic: No significant vascular findings are present. No enlarged abdominal or pelvic lymph nodes. Reproductive: Uterus and bilateral adnexa are unremarkable. Other: No abdominal wall hernia or abnormality. Small amount of free fluid in the pelvis, likely inflammatory. Musculoskeletal: No acute or significant osseous findings. IMPRESSION: Abnormal appearance of the distal sigmoid colon/ proximal rectum with area of focal asymmetric mucosal thickening and pericolonic inflammatory changes. This may represent a focal acute diverticulitis. Few extraluminal gas bubbles in the nearby mesentery suggest micro rupture. Small amount of free pelvic fluid. No evidence of drainable abscess formation. Long segment of smooth mucosal thickening of the rectum, likely inflammatory. Correlation to colonoscopy after resolution of patient's acute symptoms is recommended to exclude underlying malignancy. Electronically Signed   By: Fidela Salisbury M.D.   On: 05/04/2016 23:23    Review of Systems  Constitutional: Positive for chills. Negative for fever.  Gastrointestinal: Positive for abdominal pain, nausea and vomiting.  All other systems reviewed and are negative.  Blood pressure 137/79, pulse 105, temperature 101.3 F (38.5 C), temperature source Oral, resp. rate 16, SpO2 98 %. Physical Exam  Nursing note and vitals reviewed. Constitutional: She is oriented to person, place, and time. She appears well-developed and well-nourished.  obese  HENT:  Head: Normocephalic and atraumatic.  Eyes: Conjunctivae and EOM are normal. Pupils are equal, round, and reactive to light.  Neck: Normal range of motion. Neck supple.  Cardiovascular: Normal rate and normal heart sounds.   Respiratory: Effort  normal and breath sounds normal.  GI: Soft. Normal appearance. She exhibits no distension. Bowel sounds are absent. There is tenderness in the right lower quadrant. There is rebound and guarding. There is no rigidity.  Musculoskeletal: Normal range of motion.  Neurological: She is alert and oriented to person, place, and time. She has normal reflexes.  Skin: Skin is warm.  Psychiatric: She has a normal mood and affect. Her behavior is normal. Judgment and thought content normal.    Assessment/Plan: Diverticular sigmoid perforation with localized inflammation and air.  No CT evidence of diffuse peritonitis, but clinically she is very tender, but stable hemodynamically  Lactic acid level is pending.  I do believe that this patient can be started on IV antibiotics and possibly improve, however she needs to be watched closely and I informed her that if she worsened during the night, or failed to improve over the next 24-48 hours then she would probably require a colectomy with a colostomy  Roena Sassaman 05/05/2016, 1:05 AM

## 2016-05-05 NOTE — Progress Notes (Signed)
PROGRESS NOTE    Stacy Harris  ZOX:[id number] DOB: [birth year] DOA: 05/04/2016 PCP: Delorse Lek, MD   Brief Narrative: Stacy Harris is a 50 y.o. female with a history of hypertension, high deficiency anemia, arthritis, remote seizure. Presented with abdominal pain shunt of diverticulitis with perforation. Surgery was consulted and patient started on antibiotics. Currently trying to avoid surgical management if possible.   Assessment & Plan:   Principal Problem:   Diverticulitis of large intestine with perforation without abscess or bleeding Active Problems:   Essential hypertension   Sepsis (HCC)   Iron deficiency anemia   Diverticulitis with perforation Patient appears to be improving slightly. Still very sick -Continue Zosyn -Surgery recommendations -continue IVF -continue zofran -continue dilaudid -blood culture pending  Hypertension -continue hydralazine prn  Iron defiency anemia Slightly decreased. Likely secondary to IV fluids -repeat CBC in AM -hold iron supplements  Hypomagnesemia -replete   DVT prophylaxis: SCDs Code Status: Full code Family Communication: None at bedside Disposition Plan: Likely discharge home in 3-4 days   Consultants:   General surgery  Procedures:   None  Antimicrobials:   Zosyn (10/7>>    Subjective: Patient reports improvement in pain. She is still uncomfortable but has improved since yesterday.  Objective: Vitals:   05/05/16 0030 05/05/16 0135 05/05/16 0519 05/05/16 1043  BP: 137/79 (!) 144/80 125/70   Pulse: 105 (!) 104 96   Resp: 16 18 16    Temp:  98.6 F (37 C) 98.1 F (36.7 C)   TempSrc:  Oral    SpO2: 98% 96% 99%   Weight:    112.7 kg (248 lb 6.4 oz)  Height:    5\' 5"  (1.651 m)    Intake/Output Summary (Last 24 hours) at 05/05/16 1142 Last data filed at 05/05/16 0650  Gross per 24 hour  Intake          1483.33 ml  Output              300 ml  Net          1183.33 ml   Filed Weights   05/05/16  1043  Weight: 112.7 kg (248 lb 6.4 oz)    Examination:  General exam: Appears calm and comfortable while lying down Respiratory system: Clear to auscultation. Respiratory effort normal. Cardiovascular system: S1 & S2 heard, RRR. No murmurs, rubs, gallops or clicks. Gastrointestinal system: Abdomen is obese. Tenderness in epigastric and lower quadrants with guarding. Abdomen is soft. Normal bowel sounds heard. Central nervous system: Alert and oriented. No focal neurological deficits. Extremities: No edema. No calf tenderness Skin: No cyanosis. No rashes Psychiatry: Judgement and insight appear normal. Mood & affect appropriate.     Data Reviewed: I have personally reviewed following labs and imaging studies  CBC:  Recent Labs Lab 05/04/16 1646 05/05/16 0040 05/05/16 0322  WBC 14.4* 15.4* 14.7*  NEUTROABS  --  13.9*  --   HGB 11.4* 10.6* 9.8*  HCT 37.3 35.4* 32.8*  MCV 85.4 85.5 85.9  PLT 233 217 209   Basic Metabolic Panel:  Recent Labs Lab 05/04/16 1646 05/05/16 0322  NA 137 136  K 3.1* 4.1  CL 104 105  CO2 23 23  GLUCOSE 126* 139*  BUN 8 7  CREATININE 0.77 0.74  CALCIUM 8.7* 7.8*  MG  --  1.5*   GFR: Estimated Creatinine Clearance: 107.7 mL/min (by C-G formula based on SCr of 0.74 mg/dL). Liver Function Tests:  Recent Labs Lab 05/04/16 1646  AST 16  ALT 14  ALKPHOS 81  BILITOT 0.4  PROT 6.7  ALBUMIN 4.1    Recent Labs Lab 05/04/16 1646  LIPASE 27   No results for input(s): AMMONIA in the last 168 hours. Coagulation Profile:  Recent Labs Lab 05/05/16 0040  INR 1.11   Cardiac Enzymes: No results for input(s): CKTOTAL, CKMB, CKMBINDEX, TROPONINI in the last 168 hours. BNP (last 3 results) No results for input(s): PROBNP in the last 8760 hours. HbA1C: No results for input(s): HGBA1C in the last 72 hours. CBG:  Recent Labs Lab 05/05/16 0806  GLUCAP 118*   Lipid Profile: No results for input(s): CHOL, HDL, LDLCALC, TRIG, CHOLHDL,  LDLDIRECT in the last 72 hours. Thyroid Function Tests: No results for input(s): TSH, T4TOTAL, FREET4, T3FREE, THYROIDAB in the last 72 hours. Anemia Panel: No results for input(s): VITAMINB12, FOLATE, FERRITIN, TIBC, IRON, RETICCTPCT in the last 72 hours. Sepsis Labs:  Recent Labs Lab 05/05/16 0040 05/05/16 0105 05/05/16 0322  PROCALCITON 0.40  --   --   LATICACIDVEN 2.3* 2.35* 1.9    No results found for this or any previous visit (from the past 240 hour(s)).       Radiology Studies: Ct Abdomen Pelvis W Contrast  Result Date: 05/04/2016 CLINICAL DATA:  Lower abdominal and back pain with sudden onset 10 hours ago. EXAM: CT ABDOMEN AND PELVIS WITH CONTRAST TECHNIQUE: Multidetector CT imaging of the abdomen and pelvis was performed using the standard protocol following bolus administration of intravenous contrast. CONTRAST:  ISOVUE-300 IOPAMIDOL (ISOVUE-300) INJECTION 61% COMPARISON:  None. FINDINGS: Lower chest: No acute abnormality.  Hypoventilatory changes. Hepatobiliary: No focal liver abnormality is seen. No gallstones, gallbladder wall thickening, or biliary dilatation. Pancreas: Unremarkable. No pancreatic ductal dilatation or surrounding inflammatory changes. Spleen: Normal in size without focal abnormality. Adrenals/Urinary Tract: Normal adrenal glands. No evidence of obstructive uropathy or nephrolithiasis. Bilateral too small to be accurately characterized hypoattenuated lesions, which statistically likely represent cysts. Stomach/Bowel: Stomach is within normal limits. Appendix appears normal. There is a short segment of irregular mucosal thickening of the distal sigmoid colon/proximal rectum with extensive pericolonic mesenteric stranding. Few small diverticula are seen in the area. Several tiny extraluminal gas bubbles are also present in the nearby mesentery. Smooth symmetric mucosal thickening is seen of the downstream rectum. Vascular/Lymphatic: No significant vascular  findings are present. No enlarged abdominal or pelvic lymph nodes. Reproductive: Uterus and bilateral adnexa are unremarkable. Other: No abdominal wall hernia or abnormality. Small amount of free fluid in the pelvis, likely inflammatory. Musculoskeletal: No acute or significant osseous findings. IMPRESSION: Abnormal appearance of the distal sigmoid colon/ proximal rectum with area of focal asymmetric mucosal thickening and pericolonic inflammatory changes. This may represent a focal acute diverticulitis. Few extraluminal gas bubbles in the nearby mesentery suggest micro rupture. Small amount of free pelvic fluid. No evidence of drainable abscess formation. Long segment of smooth mucosal thickening of the rectum, likely inflammatory. Correlation to colonoscopy after resolution of patient's acute symptoms is recommended to exclude underlying malignancy. Electronically Signed   By: Ted Mcalpine M.D.   On: 05/04/2016 23:23   Dg Chest Port 1 View  Result Date: 05/05/2016 CLINICAL DATA:  Acute onset of fever. Recent acute diverticulitis with microperforation. EXAM: PORTABLE CHEST 1 VIEW COMPARISON:  Initial encounter. FINDINGS: The lungs are well-aerated and clear. There is no evidence of focal opacification, pleural effusion or pneumothorax. The cardiomediastinal silhouette is within normal limits. No acute osseous abnormalities are seen. IMPRESSION: No acute cardiopulmonary process seen. Electronically  Signed   By: Roanna RaiderJeffery  Chang M.D.   On: 05/05/2016 01:21        Scheduled Meds: . enoxaparin (LOVENOX) injection  40 mg Subcutaneous Daily  . piperacillin-tazobactam (ZOSYN)  IV  3.375 g Intravenous Q8H   Continuous Infusions: . dextrose 5 % and 0.45 % NaCl with KCl 20 mEq/L       LOS: 0 days     Jacquelin HawkingRalph Lissette Schenk [facility] 05/05/2016, 11:42 AM Pager: [phone]  If 7PM-7AM, please contact night-coverage [email or web address] Password [email or web address] 05/05/2016, 11:42 AM

## 2016-05-05 NOTE — Progress Notes (Signed)
Diverticulitis of large intestine with perforation without abscess or bleeding  Subjective: Pt with decreased abdominal pain, no flatus or BM  Objective: Vital signs in last 24 hours: Temp:  [97.7 F (36.5 C)-101.3 F (38.5 C)] 98.1 F (36.7 C) (10/07 0519) Pulse Rate:  [92-109] 96 (10/07 0519) Resp:  [12-28] 16 (10/07 0519) BP: (125-148)/(59-98) 125/70 (10/07 0519) SpO2:  [93 %-100 %] 99 % (10/07 0519) Weight:  [112.7 kg (248 lb 6.4 oz)] 112.7 kg (248 lb 6.4 oz) (10/07 1043) Last BM Date: 05/04/16  Intake/Output from previous day: 10/06 0701 - 10/07 0700 In: 1483.3 [I.V.:433.3; IV Piggyback:1050] Out: 300 [Urine:300] Intake/Output this shift: No intake/output data recorded.  General appearance: alert and cooperative GI: RLQ tenderness to palpation  Lab Results:  Results for orders placed or performed during the hospital encounter of 05/04/16 (from the past 24 hour(s))  Lipase, blood     Status: None   Collection Time: 05/04/16  4:46 PM  Result Value Ref Range   Lipase 27 11 - 51 U/L  Comprehensive metabolic panel     Status: Abnormal   Collection Time: 05/04/16  4:46 PM  Result Value Ref Range   Sodium 137 135 - 145 mmol/L   Potassium 3.1 (L) 3.5 - 5.1 mmol/L   Chloride 104 101 - 111 mmol/L   CO2 23 22 - 32 mmol/L   Glucose, Bld 126 (H) 65 - 99 mg/dL   BUN 8 6 - 20 mg/dL   Creatinine, Ser 8.11 0.44 - 1.00 mg/dL   Calcium 8.7 (L) 8.9 - 10.3 mg/dL   Total Protein 6.7 6.5 - 8.1 g/dL   Albumin 4.1 3.5 - 5.0 g/dL   AST 16 15 - 41 U/L   ALT 14 14 - 54 U/L   Alkaline Phosphatase 81 38 - 126 U/L   Total Bilirubin 0.4 0.3 - 1.2 mg/dL   GFR calc non Af Amer >60 >60 mL/min   GFR calc Af Amer >60 >60 mL/min   Anion gap 10 5 - 15  CBC     Status: Abnormal   Collection Time: 05/04/16  4:46 PM  Result Value Ref Range   WBC 14.4 (H) 4.0 - 10.5 K/uL   RBC 4.37 3.87 - 5.11 MIL/uL   Hemoglobin 11.4 (L) 12.0 - 15.0 g/dL   HCT 91.4 78.2 - 95.6 %   MCV 85.4 78.0 - 100.0 fL   MCH 26.1 26.0 - 34.0 pg   MCHC 30.6 30.0 - 36.0 g/dL   RDW 21.3 08.6 - 57.8 %   Platelets 233 150 - 400 K/uL  Urinalysis, Routine w reflex microscopic     Status: Abnormal   Collection Time: 05/04/16  7:48 PM  Result Value Ref Range   Color, Urine YELLOW YELLOW   APPearance CLEAR CLEAR   Specific Gravity, Urine 1.025 1.005 - 1.030   pH 6.5 5.0 - 8.0   Glucose, UA NEGATIVE NEGATIVE mg/dL   Hgb urine dipstick TRACE (A) NEGATIVE   Bilirubin Urine NEGATIVE NEGATIVE   Ketones, ur 15 (A) NEGATIVE mg/dL   Protein, ur NEGATIVE NEGATIVE mg/dL   Nitrite NEGATIVE NEGATIVE   Leukocytes, UA NEGATIVE NEGATIVE  Urine microscopic-add on     Status: Abnormal   Collection Time: 05/04/16  7:48 PM  Result Value Ref Range   Squamous Epithelial / LPF 0-5 (A) NONE SEEN   WBC, UA 0-5 0 - 5 WBC/hpf   RBC / HPF 0-5 0 - 5 RBC/hpf   Bacteria, UA FEW (A)  NONE SEEN   Urine-Other MUCOUS PRESENT   I-Stat Beta hCG blood, ED ([facility], [facility], [facility] only)     Status: None   Collection Time: 05/04/16  9:39 PM  Result Value Ref Range   I-stat hCG, quantitative <5.0 <5 mIU/mL   Comment 3          CBC with Differential     Status: Abnormal   Collection Time: 05/05/16 12:40 AM  Result Value Ref Range   WBC 15.4 (H) 4.0 - 10.5 K/uL   RBC 4.14 3.87 - 5.11 MIL/uL   Hemoglobin 10.6 (L) 12.0 - 15.0 g/dL   HCT 13.0 (L) 86.5 - 78.4 %   MCV 85.5 78.0 - 100.0 fL   MCH 25.6 (L) 26.0 - 34.0 pg   MCHC 29.9 (L) 30.0 - 36.0 g/dL   RDW 69.6 29.5 - 28.4 %   Platelets 217 150 - 400 K/uL   Neutrophils Relative % 90 %   Neutro Abs 13.9 (H) 1.7 - 7.7 K/uL   Lymphocytes Relative 5 %   Lymphs Abs 0.7 0.7 - 4.0 K/uL   Monocytes Relative 5 %   Monocytes Absolute 0.8 0.1 - 1.0 K/uL   Eosinophils Relative 0 %   Eosinophils Absolute 0.0 0.0 - 0.7 K/uL   Basophils Relative 0 %   Basophils Absolute 0.0 0.0 - 0.1 K/uL  Lactic acid, plasma     Status: Abnormal   Collection Time: 05/05/16 12:40 AM  Result Value Ref Range   Lactic Acid, Venous  2.3 (HH) 0.5 - 1.9 mmol/L  Procalcitonin     Status: None   Collection Time: 05/05/16 12:40 AM  Result Value Ref Range   Procalcitonin 0.40 ng/mL  Protime-INR     Status: None   Collection Time: 05/05/16 12:40 AM  Result Value Ref Range   Prothrombin Time 14.4 11.4 - 15.2 seconds   INR 1.11   APTT     Status: None   Collection Time: 05/05/16 12:40 AM  Result Value Ref Range   aPTT 36 24 - 36 seconds  I-Stat CG4 Lactic Acid, ED     Status: Abnormal   Collection Time: 05/05/16  1:05 AM  Result Value Ref Range   Lactic Acid, Venous 2.35 (HH) 0.5 - 1.9 mmol/L   Comment NOTIFIED PHYSICIAN   Lactic acid, plasma     Status: None   Collection Time: 05/05/16  3:22 AM  Result Value Ref Range   Lactic Acid, Venous 1.9 0.5 - 1.9 mmol/L  Magnesium     Status: Abnormal   Collection Time: 05/05/16  3:22 AM  Result Value Ref Range   Magnesium 1.5 (L) 1.7 - 2.4 mg/dL  Basic metabolic panel     Status: Abnormal   Collection Time: 05/05/16  3:22 AM  Result Value Ref Range   Sodium 136 135 - 145 mmol/L   Potassium 4.1 3.5 - 5.1 mmol/L   Chloride 105 101 - 111 mmol/L   CO2 23 22 - 32 mmol/L   Glucose, Bld 139 (H) 65 - 99 mg/dL   BUN 7 6 - 20 mg/dL   Creatinine, Ser 1.32 0.44 - 1.00 mg/dL   Calcium 7.8 (L) 8.9 - 10.3 mg/dL   GFR calc non Af Amer >60 >60 mL/min   GFR calc Af Amer >60 >60 mL/min   Anion gap 8 5 - 15  CBC     Status: Abnormal   Collection Time: 05/05/16  3:22 AM  Result Value Ref Range  WBC 14.7 (H) 4.0 - 10.5 K/uL   RBC 3.82 (L) 3.87 - 5.11 MIL/uL   Hemoglobin 9.8 (L) 12.0 - 15.0 g/dL   HCT 16.132.8 (L) 09.636.0 - 04.546.0 %   MCV 85.9 78.0 - 100.0 fL   MCH 25.7 (L) 26.0 - 34.0 pg   MCHC 29.9 (L) 30.0 - 36.0 g/dL   RDW 40.914.8 81.111.5 - 91.415.5 %   Platelets 209 150 - 400 K/uL  Type and screen [facility]     Status: None   Collection Time: 05/05/16  4:35 AM  Result Value Ref Range   ABO/RH(D) O POS    Antibody Screen NEG    Sample Expiration 05/08/2016   ABO/Rh      Status: None   Collection Time: 05/05/16  4:35 AM  Result Value Ref Range   ABO/RH(D) O POS   Glucose, capillary     Status: Abnormal   Collection Time: 05/05/16  8:06 AM  Result Value Ref Range   Glucose-Capillary 118 (H) 65 - 99 mg/dL     Studies/Results Radiology     MEDS, Scheduled . enoxaparin (LOVENOX) injection  40 mg Subcutaneous Daily  . piperacillin-tazobactam (ZOSYN)  IV  3.375 g Intravenous Q8H     Assessment: Diverticulitis of large intestine with perforation without abscess or bleeding Appears to be getting better  Plan: Cont current plan  Will recheck in the AM  LOS: 0 days    Vanita PandaAlicia C Marriah Sanderlin, MD [facility], [place] [phone]   05/05/2016 11:39 AM

## 2016-05-05 NOTE — Progress Notes (Signed)
Patient ID: Stacy Harris, female   DOB: 10/02/1965, 50 y.o.   MRN: [id number] Admitted early this am, feels better, still with pain, no n/v, no flatus, feels distended, tender suprapubic, wbc a little better, will continue abx, npo, start lovenox, scds, hopefully will resolve conservatively

## 2016-05-05 NOTE — ED Notes (Signed)
Portable chest xray at this time at bedside.

## 2016-05-05 NOTE — H&P (Addendum)
History and Physical    Stacy Harris ZOX:[id number] DOB: April 03, 1966 DOA: 05/04/2016  Referring MD/NP/PA:   PCP: Delorse Lek, MD   Patient coming from:  The patient is coming from home.  At baseline, pt is independent for most of ADL.  Chief Complaint: Abdominal pain and fever  HPI: Stacy Harris is a 50 y.o. female with medical history significant of hypertension, iron deficiency anemia, arthritis, remote seizure, who presents with abdominal pain and fever.  Patient states that she has sudden onset abdominal pain at about 1:30 PM. She also has fever, nausea and vomiting. No diarrhea. She vomited once without blood in the vomitus. The abdominal pain is located in the lower abdomen, sharp, 10 out of 10 severity, radiating to the back. It is not aggravated or alleviated by any known factors. Patient has mild shortness of breath, but no cough, chest pain, symptoms of a UTI or unilateral weakness.  ED Course: pt was found to have WBC 14.4, lactate 2.3, lipase 27, negative pregnancy test, negative urinalysis, temperature 101.3, tachycardia, tachypnea, O2 saturation 93% on room air, potassium 3.1, creatinine normal, negative chest x-ray. Pt is admitted to MedSurg bed as inpatient. General surgery, dr. Lindie Spruce was consulted.  # CT abdomen/pelvis showed:  focal acute diverticulitis. Few extraluminal gas bubbles in the nearby mesentery suggest micro rupture. No evidence of drainable abscess formation. Long segment of smooth mucosal thickening of the rectum, likely inflammatory.  Review of Systems:   General: has fevers, chills, no changes in body weight, has poor appetite, has fatigue HEENT: no blurry vision, hearing changes or sore throat Respiratory: has SOB, No coughing, wheezing CV: no chest pain, no palpitations GI: has nausea, vomiting, abdominal pain, no diarrhea, constipation GU: no dysuria, burning on urination, increased urinary frequency, hematuria  Ext: no leg edema Neuro: no  unilateral weakness, numbness, or tingling, no vision change or hearing loss Skin: no rash, no skin tear. MSK: No muscle spasm, no deformity, no limitation of range of movement in spin Heme: No easy bruising.  Travel history: No recent long distant travel.  Allergy:  Allergies  Allergen Reactions  . Codeine Rash    Past Medical History:  Diagnosis Date  . Anemia   . Arthritis    left knee  . Dysrhythmia    heart racing with hot flashes  . Epicondylitis elbow, medial   . Granular corneal dystrophy   . Headache    Migraines  . Hypertension   . PONV (postoperative nausea and vomiting)   . Right wrist fracture   . Seizures (HCC)    febrile sz at 9 mos  . Varicose veins     Past Surgical History:  Procedure Laterality Date  . DIAGNOSTIC LAPAROSCOPY     ovarian cyst removed  . DILATATION & CURETTAGE/HYSTEROSCOPY WITH MYOSURE N/A 06/06/2015   Procedure: DILATATION & CURETTAGE/HYSTEROSCOPY ;  Surgeon: Marlow Baars, MD;  Location: [facility];  Service: Gynecology;  Laterality: N/A;  . DILATION AND CURETTAGE OF UTERUS  1998, 2001  . LYMPH NODE DISSECTION  1985  . TENNIS ELBOW RELEASE/NIRSCHEL PROCEDURE Right 07/21/2015   Procedure:  RIGHT ELBOW DEBRIDEMENT AND TENDON REPAIR;  Surgeon: Loreta Ave, MD;  Location: [facility];  Service: Orthopedics;  Laterality: Right;  Marland Kitchen VARICOSE VEIN SURGERY  1986, 2004, 2007  . WRIST ARTHROSCOPY W/ TRIANGULAR FIBROCARTILAGE REPAIR Right 06/08/2009    Social History:  reports that she has never smoked. She has never used smokeless tobacco. She reports that she  does not drink alcohol or use drugs.  Family History:  Family History  Problem Relation Age of Onset  . Kidney disease Father   . Hypertension Father   . Lung cancer Maternal Grandmother   . Hypertension Maternal Grandfather     ? heart disease  . Kidney disease Paternal Grandmother   . Stroke Paternal Grandfather   . Hypertension Sister   . Uterine cancer Sister       Prior to Admission medications   Medication Sig Start Date End Date Taking? Authorizing Provider  aspirin-acetaminophen-caffeine (EXCEDRIN MIGRAINE) 250-250-65 MG tablet [id number] 2 tablets by mouth every 6 (six) hours as needed for migraine.   Yes Historical Provider, MD  Ferrous Sulfate (IRON) 325 (65 FE) MG TABS Take 1 tablet by mouth daily.   Yes Historical Provider, MD  guaiFENesin (MUCINEX) 600 MG 12 hr tablet Take 600 mg by mouth 2 (two) times daily as needed for to loosen phlegm.   Yes Historical Provider, MD  ibuprofen (ADVIL,MOTRIN) 200 MG tablet Take 400 mg by mouth every 6 (six) hours as needed for moderate pain.   Yes Historical Provider, MD  lisinopril (PRINIVIL,ZESTRIL) 10 MG tablet Take 10 mg by mouth daily.   Yes Historical Provider, MD  Multiple Vitamin (MULTIVITAMIN WITH MINERALS) TABS tablet Take 1 tablet by mouth daily.   Yes Historical Provider, MD  HYDROcodone-acetaminophen (NORCO) 10-325 MG tablet Take 1-2 tablets by mouth every 4 (four) hours as needed. Patient not taking: Reported on 05/04/2016 07/21/15   Cristie HemMary L Stanbery, PA-C    Physical Exam: Vitals:   05/05/16 0000 05/05/16 0015 05/05/16 0030 05/05/16 0135  BP: 139/63 134/59 137/79 (!) 144/80  Pulse: 104 107 105 (!) 104  Resp: 15 19 16 18   Temp:    98.6 F (37 C)  TempSrc:    Oral  SpO2: 97% 96% 98% 96%   General: Not in acute distress HEENT:       Eyes: PERRL, EOMI, no scleral icterus.       ENT: No discharge from the ears and nose, no pharynx injection, no tonsillar enlargement.        Neck: No JVD, no bruit, no mass felt. Heme: No neck lymph node enlargement. Cardiac: S1/S2, RRR, No murmurs, No gallops or rubs. Respiratory: . No rales, wheezing, rhonchi or rubs. GI: nondistended, has tenderness in lower abdomen with guarding, no rebound pain, no organomegaly, BS present. GU: No hematuria Ext: No pitting leg edema bilaterally. 2+DP/PT pulse bilaterally. Musculoskeletal: No joint deformities, No joint  redness or warmth, no limitation of ROM in spin. Skin: No rashes.  Neuro: Alert, oriented X3, cranial nerves II-XII grossly intact, moves all extremities normally.  Psych: Patient is not psychotic, no suicidal or hemocidal ideation.  Labs on Admission: I have personally reviewed following labs and imaging studies  CBC:  Recent Labs Lab 05/04/16 1646 05/05/16 0040 05/05/16 0322  WBC 14.4* 15.4* 14.7*  NEUTROABS  --  13.9*  --   HGB 11.4* 10.6* 9.8*  HCT 37.3 35.4* 32.8*  MCV 85.4 85.5 85.9  PLT 233 217 209   Basic Metabolic Panel:  Recent Labs Lab 05/04/16 1646  NA 137  K 3.1*  CL 104  CO2 23  GLUCOSE 126*  BUN 8  CREATININE 0.77  CALCIUM 8.7*   GFR: CrCl cannot be calculated (Unknown ideal weight.). Liver Function Tests:  Recent Labs Lab 05/04/16 1646  AST 16  ALT 14  ALKPHOS 81  BILITOT 0.4  PROT 6.7  ALBUMIN  4.1    Recent Labs Lab 05/04/16 1646  LIPASE 27   No results for input(s): AMMONIA in the last 168 hours. Coagulation Profile:  Recent Labs Lab 05/05/16 0040  INR 1.11   Cardiac Enzymes: No results for input(s): CKTOTAL, CKMB, CKMBINDEX, TROPONINI in the last 168 hours. BNP (last 3 results) No results for input(s): PROBNP in the last 8760 hours. HbA1C: No results for input(s): HGBA1C in the last 72 hours. CBG: No results for input(s): GLUCAP in the last 168 hours. Lipid Profile: No results for input(s): CHOL, HDL, LDLCALC, TRIG, CHOLHDL, LDLDIRECT in the last 72 hours. Thyroid Function Tests: No results for input(s): TSH, T4TOTAL, FREET4, T3FREE, THYROIDAB in the last 72 hours. Anemia Panel: No results for input(s): VITAMINB12, FOLATE, FERRITIN, TIBC, IRON, RETICCTPCT in the last 72 hours. Urine analysis:    Component Value Date/Time   COLORURINE YELLOW 05/04/2016 1948   APPEARANCEUR CLEAR 05/04/2016 1948   LABSPEC 1.025 05/04/2016 1948   PHURINE 6.5 05/04/2016 1948   GLUCOSEU NEGATIVE 05/04/2016 1948   HGBUR TRACE (A)  05/04/2016 1948   BILIRUBINUR NEGATIVE 05/04/2016 1948   KETONESUR 15 (A) 05/04/2016 1948   PROTEINUR NEGATIVE 05/04/2016 1948   NITRITE NEGATIVE 05/04/2016 1948   LEUKOCYTESUR NEGATIVE 05/04/2016 1948   Sepsis Labs: @LABRCNTIP (procalcitonin:4,lacticidven:4) )No results found for this or any previous visit (from the past 240 hour(s)).   Radiological Exams on Admission: Ct Abdomen Pelvis W Contrast  Result Date: 05/04/2016 CLINICAL DATA:  Lower abdominal and back pain with sudden onset 10 hours ago. EXAM: CT ABDOMEN AND PELVIS WITH CONTRAST TECHNIQUE: Multidetector CT imaging of the abdomen and pelvis was performed using the standard protocol following bolus administration of intravenous contrast. CONTRAST:  ISOVUE-300 IOPAMIDOL (ISOVUE-300) INJECTION 61% COMPARISON:  None. FINDINGS: Lower chest: No acute abnormality.  Hypoventilatory changes. Hepatobiliary: No focal liver abnormality is seen. No gallstones, gallbladder wall thickening, or biliary dilatation. Pancreas: Unremarkable. No pancreatic ductal dilatation or surrounding inflammatory changes. Spleen: Normal in size without focal abnormality. Adrenals/Urinary Tract: Normal adrenal glands. No evidence of obstructive uropathy or nephrolithiasis. Bilateral too small to be accurately characterized hypoattenuated lesions, which statistically likely represent cysts. Stomach/Bowel: Stomach is within normal limits. Appendix appears normal. There is a short segment of irregular mucosal thickening of the distal sigmoid colon/proximal rectum with extensive pericolonic mesenteric stranding. Few small diverticula are seen in the area. Several tiny extraluminal gas bubbles are also present in the nearby mesentery. Smooth symmetric mucosal thickening is seen of the downstream rectum. Vascular/Lymphatic: No significant vascular findings are present. No enlarged abdominal or pelvic lymph nodes. Reproductive: Uterus and bilateral adnexa are unremarkable.  Other: No abdominal wall hernia or abnormality. Small amount of free fluid in the pelvis, likely inflammatory. Musculoskeletal: No acute or significant osseous findings. IMPRESSION: Abnormal appearance of the distal sigmoid colon/ proximal rectum with area of focal asymmetric mucosal thickening and pericolonic inflammatory changes. This may represent a focal acute diverticulitis. Few extraluminal gas bubbles in the nearby mesentery suggest micro rupture. Small amount of free pelvic fluid. No evidence of drainable abscess formation. Long segment of smooth mucosal thickening of the rectum, likely inflammatory. Correlation to colonoscopy after resolution of patient's acute symptoms is recommended to exclude underlying malignancy. Electronically Signed   By: Ted Mcalpine M.D.   On: 05/04/2016 23:23   Dg Chest Port 1 View  Result Date: 05/05/2016 CLINICAL DATA:  Acute onset of fever. Recent acute diverticulitis with microperforation. EXAM: PORTABLE CHEST 1 VIEW COMPARISON:  Initial encounter. FINDINGS: The  lungs are well-aerated and clear. There is no evidence of focal opacification, pleural effusion or pneumothorax. The cardiomediastinal silhouette is within normal limits. No acute osseous abnormalities are seen. IMPRESSION: No acute cardiopulmonary process seen. Electronically Signed   By: Roanna Raider M.D.   On: 05/05/2016 01:21     EKG: Not done in ED, will get one.   Assessment/Plan Principal Problem:   Diverticulitis of large intestine with perforation without abscess or bleeding Active Problems:   Essential hypertension   Sepsis (HCC)   Iron deficiency anemia  Diverticulitis of large intestine with perforation without abscess or bleeding and sepsis: Patient admitted criteria for sepsis with fever, tachycardia, tachypnea, leukocytosis and elevated lactate. Currently hemodynamically stable.  -will admit to Med-surg bed as inpt. -start IV Zosyn -When necessary Zofran for nausea,  Dilaudid for pain --will get Procalcitonin and trend lactic acid levels per sepsis protocol. -IVF: 3L of NS bolus in ED, followed by 100 cc/h  -Follow-up surgeons further recommendation -INR/PTT/type & screen  HTN: -hold oral meds -IV hydralazine when necessary  Iron deficiency anemia: Hemoglobin 11.4 -Hold iron supplements now  DVT ppx: SCD Code Status: Full code Family Communication: Yes, patient's husband at bed side Disposition Plan:  Anticipate discharge back to previous home environment Consults called:  Development worker, international aid, Dr. Lindie Spruce Admission status: Medical floor/inpt      Date of Service 05/05/2016    Lorretta Harp [facility] Pager [phone]  If 7PM-7AM, please contact night-coverage [email or web address] Password [email or web address] 05/05/2016, 4:06 AM

## 2016-05-05 NOTE — Progress Notes (Signed)
Pharmacy Antibiotic Note  Stacy Harris is a 50 y.o. female admitted on 05/04/2016 with acute diverticulitis with microperforation.  Pharmacy has been consulted for Zosyn dosing.  Plan: Zosyn 3.375g IV q8h (4 hour infusion).     Temp (24hrs), Avg:99.5 F (37.5 C), Min:97.7 F (36.5 C), Max:101.3 F (38.5 C)   Recent Labs Lab 05/04/16 1646  WBC 14.4*  CREATININE 0.77     Allergies  Allergen Reactions  . Codeine Rash     Thank you for allowing pharmacy to be a part of this patient's care.  Vernard GamblesVeronda Angeldejesus Callaham, PharmD, BCPS  05/05/2016 12:24 AM

## 2016-05-06 LAB — BASIC METABOLIC PANEL
ANION GAP: 6 (ref 5–15)
BUN: <5 mg/dL — ABNORMAL LOW (ref 6–20)
CHLORIDE: 103 mmol/L (ref 101–111)
CO2: 26 mmol/L (ref 22–32)
Calcium: 7.8 mg/dL — ABNORMAL LOW (ref 8.9–10.3)
Creatinine, Ser: 0.77 mg/dL (ref 0.44–1.00)
Glucose, Bld: 130 mg/dL — ABNORMAL HIGH (ref 65–99)
POTASSIUM: 3.7 mmol/L (ref 3.5–5.1)
SODIUM: 135 mmol/L (ref 135–145)

## 2016-05-06 LAB — CBC
HCT: 33.6 % — ABNORMAL LOW (ref 36.0–46.0)
HEMATOCRIT: 33.6 % — AB (ref 36.0–46.0)
HEMOGLOBIN: 10 g/dL — AB (ref 12.0–15.0)
HEMOGLOBIN: 10 g/dL — AB (ref 12.0–15.0)
MCH: 25.9 pg — ABNORMAL LOW (ref 26.0–34.0)
MCH: 25.8 pg — AB (ref 26.0–34.0)
MCHC: 29.8 g/dL — ABNORMAL LOW (ref 30.0–36.0)
MCHC: 29.8 g/dL — ABNORMAL LOW (ref 30.0–36.0)
MCV: 86.8 fL (ref 78.0–100.0)
MCV: 87 fL (ref 78.0–100.0)
PLATELETS: 218 10*3/uL (ref 150–400)
Platelets: 204 10*3/uL (ref 150–400)
RBC: 3.87 MIL/uL (ref 3.87–5.11)
RBC: 3.86 MIL/uL — AB (ref 3.87–5.11)
RDW: 15.1 % (ref 11.5–15.5)
RDW: 15.1 % (ref 11.5–15.5)
WBC: 10.6 10*3/uL — AB (ref 4.0–10.5)
WBC: 10.4 10*3/uL (ref 4.0–10.5)

## 2016-05-06 LAB — GLUCOSE, CAPILLARY: Glucose-Capillary: 115 mg/dL — ABNORMAL HIGH (ref 65–99)

## 2016-05-06 MED ORDER — OXYCODONE HCL 5 MG PO TABS
5.0000 mg | ORAL_TABLET | Freq: Four times a day (QID) | ORAL | Status: DC | PRN
  Administered 2016-05-06 – 2016-05-08 (×5): 5 mg via ORAL
  Filled 2016-05-06 (×6): qty 1

## 2016-05-06 NOTE — Progress Notes (Signed)
  Progress Note: General Surgery Service   Subjective: Pain much improved, +flatus  Objective: Vital signs in last 24 hours: Temp:  [98.2 F (36.8 C)-99.5 F (37.5 C)] 99.5 F (37.5 C) (10/08 0512) Pulse Rate:  [83-95] 94 (10/08 0512) Resp:  [19] 19 (10/08 0512) BP: (128-144)/(75-82) 144/75 (10/08 0512) SpO2:  [92 %-98 %] 92 % (10/08 0512) Last BM Date: 05/04/16  Intake/Output from previous day: 10/07 0701 - 10/08 0700 In: 2066.7 [I.V.:1866.7; IV Piggyback:200] Out: 900 [Urine:900] Intake/Output this shift: Total I/O In: -  Out: 350 [Urine:350]  Lungs: CTAB  Cardiovascular: RRR  Abd: soft, lower abdominal tenderness, ND  Extremities: no edema  Neuro: AOx4  Lab Results: CBC   Recent Labs  05/05/16 0322 05/06/16 0502  WBC 14.7* 10.6*  10.4  HGB 9.8* 10.0*  10.0*  HCT 32.8* 33.6*  33.6*  PLT 209 218  204   BMET  Recent Labs  05/05/16 0322 05/06/16 0502  NA 136 135  K 4.1 3.7  CL 105 103  CO2 23 26  GLUCOSE 139* 130*  BUN 7 <5*  CREATININE 0.74 0.77  CALCIUM 7.8* 7.8*   PT/INR  Recent Labs  05/05/16 0040  LABPROT 14.4  INR 1.11   ABG No results for input(s): PHART, HCO3 in the last 72 hours.  Invalid input(s): PCO2, PO2  Studies/Results:  Anti-infectives: Anti-infectives    Start     Dose/Rate Route Frequency Ordered Stop   05/05/16 0600  piperacillin-tazobactam (ZOSYN) IVPB 3.375 g     3.375 g 12.5 mL/hr over 240 Minutes Intravenous Every 8 hours 05/05/16 0025     05/04/16 2345  piperacillin-tazobactam (ZOSYN) IVPB 3.375 g     3.375 g 100 mL/hr over 30 Minutes Intravenous  Once 05/04/16 2339 05/05/16 0121      Medications: Scheduled Meds: . enoxaparin (LOVENOX) injection  40 mg Subcutaneous Daily  . piperacillin-tazobactam (ZOSYN)  IV  3.375 g Intravenous Q8H   Continuous Infusions: . dextrose 5 % and 0.45 % NaCl with KCl 20 mEq/L 100 mL/hr at 05/06/16 0430   PRN Meds:.acetaminophen **OR** acetaminophen, hydrALAZINE,  HYDROmorphone (DILAUDID) injection, Influenza vac split quadrivalent PF, lip balm, ondansetron (ZOFRAN) IV, zolpidem  Assessment/Plan: Patient Active Problem List   Diagnosis Date Noted  . Sepsis (HCC) 05/05/2016  . Iron deficiency anemia 05/05/2016  . Diverticulitis of large intestine with perforation without abscess or bleeding   . Abnormal EKG 06/16/2015  . Essential hypertension 06/16/2015  diverticulitis with perforation, leukocytosis resolved -ok for clears today -continue IV abx    LOS: 1 day   Rodman PickleLuke Aaron Kinsinger, MD Pg# [phone] [facility]

## 2016-05-06 NOTE — Progress Notes (Addendum)
PROGRESS NOTE    Stacy Harris  ZOX:[id number] DOB: August 28, 1965 DOA: 05/04/2016 PCP: Delorse Lek, MD   Brief Narrative: Stacy Harris is a 50 y.o. female with a history of hypertension, high deficiency anemia, arthritis, remote seizure. Presented with abdominal pain shunt of diverticulitis with perforation. Surgery was consulted and patient started on antibiotics. Currently trying to avoid surgical management if possible.   Assessment & Plan:   Principal Problem:   Diverticulitis of large intestine with perforation without abscess or bleeding Active Problems:   Essential hypertension   Sepsis (HCC)   Iron deficiency anemia   Diverticulitis with perforation Improved today. Anticipate no surgery. -Continue Zosyn -Surgery recommendations -continue IVF -continue zofran -continue dilaudid -blood culture pending  Hypertension -continue hydralazine prn  Iron defiency anemia Slightly decreased. Likely secondary to IV fluids. Hemoglobin stable  Hypomagnesemia -repleted  DVT prophylaxis: lovenox subq Code Status: Harris code Family Communication: None at bedside Disposition Plan: Likely discharge home in 2-3 days   Consultants:   General surgery  Procedures:   None  Antimicrobials:   Zosyn (10/7>>    Subjective: Pain better. Still uncomfortable especially when moving/getting up. No vomiting.  Objective: Vitals:   05/05/16 1844 05/05/16 2105 05/06/16 0512 05/06/16 1113  BP:  128/77 (!) 144/75   Pulse:  95 94   Resp:  19 19   Temp: 99.2 F (37.3 C) 98.2 F (36.8 C) 99.5 F (37.5 C) 99.5 F (37.5 C)  TempSrc: Oral   Oral  SpO2:  95% 92%   Weight:      Height:        Intake/Output Summary (Last 24 hours) at 05/06/16 1146 Last data filed at 05/06/16 1033  Gross per 24 hour  Intake          2066.66 ml  Output             1250 ml  Net           816.66 ml   Filed Weights   05/05/16 1043  Weight: 112.7 kg (248 lb 6.4 oz)    Examination:  General  exam: Appears calm and comfortable while lying down Respiratory system: Clear to auscultation. Respiratory effort normal. Cardiovascular system: S1 & S2 heard, RRR. No murmurs, rubs, gallops or clicks. Gastrointestinal system: Abdomen is obese. Tenderness in lower quadrants witouth guarding. Abdomen is soft. Normal bowel sounds heard. Central nervous system: Alert and oriented. No focal neurological deficits. Extremities: No edema. No calf tenderness Skin: No cyanosis. No rashes Psychiatry: Judgement and insight appear normal. Mood & affect appropriate.     Data Reviewed: I have personally reviewed following labs and imaging studies  CBC:  Recent Labs Lab 05/04/16 1646 05/05/16 0040 05/05/16 0322 05/06/16 0502  WBC 14.4* 15.4* 14.7* 10.6*  10.4  NEUTROABS  --  13.9*  --   --   HGB 11.4* 10.6* 9.8* 10.0*  10.0*  HCT 37.3 35.4* 32.8* 33.6*  33.6*  MCV 85.4 85.5 85.9 86.8  87.0  PLT 233 217 209 218  204   Basic Metabolic Panel:  Recent Labs Lab 05/04/16 1646 05/05/16 0322 05/06/16 0502  NA 137 136 135  K 3.1* 4.1 3.7  CL 104 105 103  CO2 23 23 26   GLUCOSE 126* 139* 130*  BUN 8 7 <5*  CREATININE 0.77 0.74 0.77  CALCIUM 8.7* 7.8* 7.8*  MG  --  1.5*  --    GFR: Estimated Creatinine Clearance: 107.7 mL/min (by C-G formula based on SCr of  0.77 mg/dL). Liver Function Tests:  Recent Labs Lab 05/04/16 1646  AST 16  ALT 14  ALKPHOS 81  BILITOT 0.4  PROT 6.7  ALBUMIN 4.1    Recent Labs Lab 05/04/16 1646  LIPASE 27   No results for input(s): AMMONIA in the last 168 hours. Coagulation Profile:  Recent Labs Lab 05/05/16 0040  INR 1.11   Cardiac Enzymes: No results for input(s): CKTOTAL, CKMB, CKMBINDEX, TROPONINI in the last 168 hours. BNP (last 3 results) No results for input(s): PROBNP in the last 8760 hours. HbA1C: No results for input(s): HGBA1C in the last 72 hours. CBG:  Recent Labs Lab 05/05/16 0806 05/06/16 0812  GLUCAP 118* 115*    Lipid Profile: No results for input(s): CHOL, HDL, LDLCALC, TRIG, CHOLHDL, LDLDIRECT in the last 72 hours. Thyroid Function Tests: No results for input(s): TSH, T4TOTAL, FREET4, T3FREE, THYROIDAB in the last 72 hours. Anemia Panel: No results for input(s): VITAMINB12, FOLATE, FERRITIN, TIBC, IRON, RETICCTPCT in the last 72 hours. Sepsis Labs:  Recent Labs Lab 05/05/16 0040 05/05/16 0105 05/05/16 0322  PROCALCITON 0.40  --   --   LATICACIDVEN 2.3* 2.35* 1.9    No results found for this or any previous visit (from the past 240 hour(s)).       Radiology Studies: Ct Abdomen Pelvis W Contrast  Result Date: 05/04/2016 CLINICAL DATA:  Lower abdominal and back pain with sudden onset 10 hours ago. EXAM: CT ABDOMEN AND PELVIS WITH CONTRAST TECHNIQUE: Multidetector CT imaging of the abdomen and pelvis was performed using the standard protocol following bolus administration of intravenous contrast. CONTRAST:  100mL ISOVUE-300 IOPAMIDOL (ISOVUE-300) INJECTION 61% COMPARISON:  None. FINDINGS: Lower chest: No acute abnormality.  Hypoventilatory changes. Hepatobiliary: No focal liver abnormality is seen. No gallstones, gallbladder wall thickening, or biliary dilatation. Pancreas: Unremarkable. No pancreatic ductal dilatation or surrounding inflammatory changes. Spleen: Normal in size without focal abnormality. Adrenals/Urinary Tract: Normal adrenal glands. No evidence of obstructive uropathy or nephrolithiasis. Bilateral too small to be accurately characterized hypoattenuated lesions, which statistically likely represent cysts. Stomach/Bowel: Stomach is within normal limits. Appendix appears normal. There is a short segment of irregular mucosal thickening of the distal sigmoid colon/proximal rectum with extensive pericolonic mesenteric stranding. Few small diverticula are seen in the area. Several tiny extraluminal gas bubbles are also present in the nearby mesentery. Smooth symmetric mucosal  thickening is seen of the downstream rectum. Vascular/Lymphatic: No significant vascular findings are present. No enlarged abdominal or pelvic lymph nodes. Reproductive: Uterus and bilateral adnexa are unremarkable. Other: No abdominal wall hernia or abnormality. Small amount of free fluid in the pelvis, likely inflammatory. Musculoskeletal: No acute or significant osseous findings. IMPRESSION: Abnormal appearance of the distal sigmoid colon/ proximal rectum with area of focal asymmetric mucosal thickening and pericolonic inflammatory changes. This may represent a focal acute diverticulitis. Few extraluminal gas bubbles in the nearby mesentery suggest micro rupture. Small amount of free pelvic fluid. No evidence of drainable abscess formation. Long segment of smooth mucosal thickening of the rectum, likely inflammatory. Correlation to colonoscopy after resolution of patient's acute symptoms is recommended to exclude underlying malignancy. Electronically Signed   By: Ted Mcalpineobrinka  Dimitrova M.D.   On: 05/04/2016 23:23   Dg Chest Port 1 View  Result Date: 05/05/2016 CLINICAL DATA:  Acute onset of fever. Recent acute diverticulitis with microperforation. EXAM: PORTABLE CHEST 1 VIEW COMPARISON:  Initial encounter. FINDINGS: The lungs are well-aerated and clear. There is no evidence of focal opacification, pleural effusion or pneumothorax. The cardiomediastinal  silhouette is within normal limits. No acute osseous abnormalities are seen. IMPRESSION: No acute cardiopulmonary process seen. Electronically Signed   By: Roanna Raider M.D.   On: 05/05/2016 01:21        Scheduled Meds: . enoxaparin (LOVENOX) injection  40 mg Subcutaneous Daily  . piperacillin-tazobactam (ZOSYN)  IV  3.375 g Intravenous Q8H   Continuous Infusions: . dextrose 5 % and 0.45 % NaCl with KCl 20 mEq/L 100 mL/hr at 05/06/16 0430     LOS: 1 day     Jacquelin Hawking [facility] 05/06/2016, 11:46 AM Pager: [phone]  If  7PM-7AM, please contact night-coverage [email or web address] Password [email or web address] 05/06/2016, 11:46 AM

## 2016-05-07 LAB — BASIC METABOLIC PANEL
Anion gap: 6 (ref 5–15)
CHLORIDE: 104 mmol/L (ref 101–111)
CO2: 27 mmol/L (ref 22–32)
Calcium: 8 mg/dL — ABNORMAL LOW (ref 8.9–10.3)
Creatinine, Ser: 0.7 mg/dL (ref 0.44–1.00)
GFR calc Af Amer: >60 mL/min (ref >60)
GFR calc non Af Amer: >60 mL/min (ref >60)
GLUCOSE: 141 mg/dL — AB (ref 65–99)
POTASSIUM: 3.6 mmol/L (ref 3.5–5.1)
SODIUM: 137 mmol/L (ref 135–145)

## 2016-05-07 LAB — CBC
HCT: 31.5 % — ABNORMAL LOW (ref 36.0–46.0)
Hemoglobin: 9.5 g/dL — ABNORMAL LOW (ref 12.0–15.0)
MCH: 26 pg (ref 26.0–34.0)
MCHC: 30.2 g/dL (ref 30.0–36.0)
MCV: 86.1 fL (ref 78.0–100.0)
Platelets: 195 K/uL (ref 150–400)
RBC: 3.66 MIL/uL — ABNORMAL LOW (ref 3.87–5.11)
RDW: 14.8 % (ref 11.5–15.5)
WBC: 7.9 K/uL (ref 4.0–10.5)

## 2016-05-07 LAB — GLUCOSE, CAPILLARY: Glucose-Capillary: 101 mg/dL — ABNORMAL HIGH (ref 65–99)

## 2016-05-07 LAB — IRON AND TIBC
Iron: 13 ug/dL — ABNORMAL LOW (ref 28–170)
SATURATION RATIOS: 5 % — AB (ref 10.4–31.8)
TIBC: 274 ug/dL (ref 250–450)
UIBC: 261 ug/dL

## 2016-05-07 LAB — FERRITIN: Ferritin: 55 ng/mL (ref 11–307)

## 2016-05-07 LAB — MAGNESIUM: MAGNESIUM: 1.9 mg/dL (ref 1.7–2.4)

## 2016-05-07 MED ORDER — ONDANSETRON HCL 4 MG/2ML IJ SOLN
4.0000 mg | Freq: Four times a day (QID) | INTRAMUSCULAR | Status: DC | PRN
  Administered 2016-05-07: 4 mg via INTRAVENOUS
  Filled 2016-05-07: qty 2

## 2016-05-07 NOTE — Progress Notes (Signed)
PROGRESS NOTE    Stacy Harris  ZOX:[id number] DOB: 01/04/1966 DOA: 05/04/2016 PCP: Delorse LekBURNETT,BRENT A, MD   Brief Narrative: Stacy Harris is a 50 y.o. female with a history of hypertension, high deficiency anemia, arthritis, remote seizure. Presented with abdominal pain shunt of diverticulitis with perforation. Surgery was consulted and patient started on antibiotics. Currently trying to avoid surgical management if possible.   Assessment & Plan:   Principal Problem:   Diverticulitis of large intestine with perforation without abscess or bleeding Active Problems:   Essential hypertension   Sepsis (HCC)   Iron deficiency anemia   Diverticulitis with perforation Improved today. Anticipate no surgery. -Continue Zosyn -Surgery recommendations -continue IVF -continue zofran -continue dilaudid/oxycodone -blood culture no growth x1 day  Hypertension -continue hydralazine prn  Iron defiency anemia Slightly decreased. Likely secondary to IV fluids. Hemoglobin stable. Iron panel significant for low iron. Ferritin is normal in setting of acute infection. -continue iron supplementation when able to take PO  Hypomagnesemia -repleted  DVT prophylaxis: lovenox subq (patient initially refused but will take now) Code Status: Full code Family Communication: None at bedside Disposition Plan: Likely discharge home pending improvement   Consultants:   General surgery  Procedures:   None  Antimicrobials:   Zosyn (10/7>>    Subjective: Pain improved. Had an episode of nausea then vomited once.   Objective: Vitals:   05/06/16 1113 05/06/16 1516 05/06/16 2132 05/07/16 0556  BP:  136/79 (!) 142/81 128/65  Pulse:  90 78 80  Resp:  16 18 18   Temp: 99.5 F (37.5 C) 99 F (37.2 C) 98.5 F (36.9 C) 98.6 F (37 C)  TempSrc: Oral Oral    SpO2:  98% 96% 93%  Weight:      Height:        Intake/Output Summary (Last 24 hours) at 05/07/16 0950 Last data filed at 05/07/16 0826  Gross per 24 hour  Intake          1281.67 ml  Output             1350 ml  Net           -68.33 ml   Filed Weights   05/05/16 1043  Weight: 112.7 kg (248 lb 6.4 oz)    Examination:  General exam: Appears calm and comfortable while lying down Respiratory system: Clear to auscultation. Respiratory effort normal. Cardiovascular system: S1 & S2 heard, RRR. No murmurs, rubs, gallops or clicks. Gastrointestinal system: Abdomen is obese. No tenderness. Abdomen is soft. Normal bowel sounds heard. Central nervous system: Alert and oriented. No focal neurological deficits. Extremities: No edema. No calf tenderness Skin: No cyanosis. No rashes Psychiatry: Judgement and insight appear normal. Mood & affect appropriate.     Data Reviewed: I have personally reviewed following labs and imaging studies  CBC:  Recent Labs Lab 05/04/16 1646 05/05/16 0040 05/05/16 0322 05/06/16 0502 05/07/16 0526  WBC 14.4* 15.4* 14.7* 10.6*  10.4 7.9  NEUTROABS  --  13.9*  --   --   --   HGB 11.4* 10.6* 9.8* 10.0*  10.0* 9.5*  HCT 37.3 35.4* 32.8* 33.6*  33.6* 31.5*  MCV 85.4 85.5 85.9 86.8  87.0 86.1  PLT 233 217 209 218  204 195   Basic Metabolic Panel:  Recent Labs Lab 05/04/16 1646 05/05/16 0322 05/06/16 0502 05/07/16 0526  NA 137 136 135 137  K 3.1* 4.1 3.7 3.6  CL 104 105 103 104  CO2 23 23 26  27  GLUCOSE 126* 139* 130* 141*  BUN 8 7 <5* <5*  CREATININE 0.77 0.74 0.77 0.70  CALCIUM 8.7* 7.8* 7.8* 8.0*  MG  --  1.5*  --  1.9   GFR: Estimated Creatinine Clearance: 107.7 mL/min (by C-G formula based on SCr of 0.7 mg/dL). Liver Function Tests:  Recent Labs Lab 05/04/16 1646  AST 16  ALT 14  ALKPHOS 81  BILITOT 0.4  PROT 6.7  ALBUMIN 4.1    Recent Labs Lab 05/04/16 1646  LIPASE 27   No results for input(s): AMMONIA in the last 168 hours. Coagulation Profile:  Recent Labs Lab 05/05/16 0040  INR 1.11   Cardiac Enzymes: No results for input(s): CKTOTAL, CKMB,  CKMBINDEX, TROPONINI in the last 168 hours. BNP (last 3 results) No results for input(s): PROBNP in the last 8760 hours. HbA1C: No results for input(s): HGBA1C in the last 72 hours. CBG:  Recent Labs Lab 05/05/16 0806 05/06/16 0812 05/07/16 0743  GLUCAP 118* 115* 101*   Lipid Profile: No results for input(s): CHOL, HDL, LDLCALC, TRIG, CHOLHDL, LDLDIRECT in the last 72 hours. Thyroid Function Tests: No results for input(s): TSH, T4TOTAL, FREET4, T3FREE, THYROIDAB in the last 72 hours. Anemia Panel:  Recent Labs  05/07/16 0805  FERRITIN 55  TIBC 274  IRON 13*   Sepsis Labs:  Recent Labs Lab 05/05/16 0040 05/05/16 0105 05/05/16 0322  PROCALCITON 0.40  --   --   LATICACIDVEN 2.3* 2.35* 1.9    Recent Results (from the past 240 hour(s))  Culture, blood (x 2)     Status: None (Preliminary result)   Collection Time: 05/05/16 12:40 AM  Result Value Ref Range Status   Specimen Description BLOOD RIGHT ARM  Final   Special Requests BOTTLES DRAWN AEROBIC AND ANAEROBIC  Final   Culture NO GROWTH 1 DAY  Final   Report Status PENDING  Incomplete  Culture, blood (x 2)     Status: None (Preliminary result)   Collection Time: 05/05/16 12:45 AM  Result Value Ref Range Status   Specimen Description BLOOD RIGHT HAND  Final   Special Requests BOTTLES DRAWN AEROBIC AND ANAEROBIC  Final   Culture NO GROWTH 1 DAY  Final   Report Status PENDING  Incomplete         Radiology Studies: No results found.      Scheduled Meds: . enoxaparin (LOVENOX) injection  40 mg Subcutaneous Daily  . piperacillin-tazobactam (ZOSYN)  IV  3.375 g Intravenous Q8H   Continuous Infusions: . dextrose 5 % and 0.45 % NaCl with KCl 20 mEq/L 100 mL/hr at 05/07/16 0307     LOS: 2 days     Jacquelin Hawking [facility] 05/07/2016, 9:50 AM Pager: [phone]  If 7PM-7AM, please contact night-coverage [email or web address] Password [email or web address] 05/07/2016, 9:50 AM

## 2016-05-07 NOTE — Care Management Note (Signed)
Case Management Note  Patient Details  Name: Stacy Harris MRN: [id number] Date of Birth: 11/03/1965  Subjective/Objective:                 Independent patient from home with spouse admitted for diverticulitis with perf. Iv Abx, c/o nausea, not tolerating clears.  PCP Dr. Doristine CounterBurnett   Action/Plan:  No CM needs identified at this time, will continue to follow.   Expected Discharge Date:                  Expected Discharge Plan:  Home/Self Care  In-House Referral:  NA  Discharge planning Services  CM Consult  Post Acute Care Choice:  NA Choice offered to:  NA  DME Arranged:  N/A DME Agency:  NA  HH Arranged:  NA HH Agency:  NA  Status of Service:  In process, will continue to follow  If discussed at Long Length of Stay Meetings, dates discussed:    Additional Comments:  Lawerance SabalDebbie Rilei Kravitz, RN 05/07/2016, 10:54 AM

## 2016-05-07 NOTE — Progress Notes (Signed)
  Subjective: Overall much less abd pain than on admission but just had episode of n/v. Threw up broth. +flatus.   Objective: Vital signs in last 24 hours: Temp:  [98.5 F (36.9 C)-99.5 F (37.5 C)] 98.6 F (37 C) (10/09 0556) Pulse Rate:  [78-90] 80 (10/09 0556) Resp:  [16-18] 18 (10/09 0556) BP: (128-142)/(65-81) 128/65 (10/09 0556) SpO2:  [93 %-98 %] 93 % (10/09 0556) Last BM Date: 05/04/16  Intake/Output from previous day: 10/08 0701 - 10/09 0700 In: 1281.7 [I.V.:1231.7; IV Piggyback:50] Out: 1050 [Urine:1050] Intake/Output this shift: Total I/O In: -  Out: 300 [Urine:300]  Sitting on edge of bed A little ill appearing but non toxic cta Reg Obese, soft, min TTP  Lab Results:   Recent Labs  05/06/16 0502 05/07/16 0526  WBC 10.6*  10.4 7.9  HGB 10.0*  10.0* 9.5*  HCT 33.6*  33.6* 31.5*  PLT 218  204 195   BMET  Recent Labs  05/06/16 0502 05/07/16 0526  NA 135 137  K 3.7 3.6  CL 103 104  CO2 26 27  GLUCOSE 130* 141*  BUN <5* <5*  CREATININE 0.77 0.70  CALCIUM 7.8* 8.0*   PT/INR  Recent Labs  05/05/16 0040  LABPROT 14.4  INR 1.11   ABG No results for input(s): PHART, HCO3 in the last 72 hours.  Invalid input(s): PCO2, PO2  Studies/Results: No results found.  Anti-infectives: Anti-infectives    Start     Dose/Rate Route Frequency Ordered Stop   05/05/16 0600  piperacillin-tazobactam (ZOSYN) IVPB 3.375 g     3.375 g 12.5 mL/hr over 240 Minutes Intravenous Every 8 hours 05/05/16 0025     05/04/16 2345  piperacillin-tazobactam (ZOSYN) IVPB 3.375 g     3.375 g 100 mL/hr over 30 Minutes Intravenous  Once 05/04/16 2339 05/05/16 0121      Assessment/Plan: Sigmoid diverticulitis with microperf  Wbc normalized, no fever, no tachycardia but given n/v this am would NOT adv diet today Stay on clears Cont IV abx Hopefully just a little ileus rediscussed diverticulitis and its typical mgmt Will need outpt colonoscopy if this episode  resolves Discussed that if pain worsens, wbc increases, fever spikes will need to re-evaluate  Mary SellaEric M. Andrey CampanileWilson, MD, FACS General, Bariatric, & Minimally Invasive Surgery [facility] [place]    LOS: 2 days    Atilano InaWILSON,Jeralyn Nolden M 05/07/2016

## 2016-05-08 LAB — CBC
HEMATOCRIT: 33 % — AB (ref 36.0–46.0)
HEMOGLOBIN: 9.9 g/dL — AB (ref 12.0–15.0)
MCH: 25.6 pg — AB (ref 26.0–34.0)
MCHC: 30 g/dL (ref 30.0–36.0)
MCV: 85.5 fL (ref 78.0–100.0)
Platelets: 202 10*3/uL (ref 150–400)
RBC: 3.86 MIL/uL — AB (ref 3.87–5.11)
RDW: 14.6 % (ref 11.5–15.5)
WBC: 5.8 10*3/uL (ref 4.0–10.5)

## 2016-05-08 LAB — BASIC METABOLIC PANEL
ANION GAP: 8 (ref 5–15)
CHLORIDE: 104 mmol/L (ref 101–111)
CO2: 25 mmol/L (ref 22–32)
Calcium: 8.3 mg/dL — ABNORMAL LOW (ref 8.9–10.3)
Creatinine, Ser: 0.58 mg/dL (ref 0.44–1.00)
GFR calc Af Amer: >60 mL/min (ref >60)
GLUCOSE: 122 mg/dL — AB (ref 65–99)
POTASSIUM: 3.4 mmol/L — AB (ref 3.5–5.1)
Sodium: 137 mmol/L (ref 135–145)

## 2016-05-08 LAB — GLUCOSE, CAPILLARY: Glucose-Capillary: 117 mg/dL — ABNORMAL HIGH (ref 65–99)

## 2016-05-08 MED ORDER — ENOXAPARIN SODIUM 60 MG/0.6ML ~~LOC~~ SOLN
0.5000 mg/kg | Freq: Every day | SUBCUTANEOUS | Status: DC
  Administered 2016-05-08: 55 mg via SUBCUTANEOUS
  Filled 2016-05-08 (×2): qty 0.6

## 2016-05-08 MED ORDER — POTASSIUM CHLORIDE CRYS ER 20 MEQ PO TBCR
40.0000 meq | EXTENDED_RELEASE_TABLET | Freq: Once | ORAL | Status: AC
  Administered 2016-05-08: 40 meq via ORAL
  Filled 2016-05-08: qty 2

## 2016-05-08 NOTE — Progress Notes (Signed)
PROGRESS NOTE    Stacy Harris  MVH:[id number] DOB: 1965-11-01 DOA: 05/04/2016 PCP: Delorse Lek, MD   Brief Narrative: Stacy Harris is a 50 y.o. female with a history of hypertension, high deficiency anemia, arthritis, remote seizure. Presented with abdominal pain shunt of diverticulitis with perforation. Surgery was consulted and patient started on antibiotics. Currently trying to avoid surgical management if possible.   Assessment & Plan:   Principal Problem:   Diverticulitis of large intestine with perforation without abscess or bleeding Active Problems:   Essential hypertension   Sepsis (HCC)   Iron deficiency anemia   Diverticulitis with perforation Improved today. Anticipate no surgery. -Continue Zosyn per surgery -advance diet per surgery -continue IVF -continue zofran -continue dilaudid/oxycodone -blood culture no growth x3 day  Hypertension Not on medication as an outpatient -continue hydralazine prn  Iron defiency anemia Hemoglobin stable. Iron panel significant for low iron. Ferritin is normal in setting of acute infection. -continue iron supplementation when able to take PO and bowel habits improved  Hypomagnesemia -repleted  DVT prophylaxis: lovenox subq Code Status: Full code Family Communication: None at bedside Disposition Plan: Likely discharge home pending improvement   Consultants:   General surgery  Procedures:   None  Antimicrobials:   Zosyn (10/7>>    Subjective: Pain improving. Tolerating diet more. Looking forward to a shower.   Objective: Vitals:   05/07/16 0556 05/07/16 1556 05/07/16 2122 05/08/16 0510  BP: 128/65 (!) 145/71 (!) 159/97 (!) 147/81  Pulse: 80 84 89 83  Resp: 18 18 18 15   Temp: 98.6 F (37 C) 98.8 F (37.1 C) 98.4 F (36.9 C) 98 F (36.7 C)  TempSrc:  Axillary Oral   SpO2: 93% 98% 100% 96%  Weight:      Height:        Intake/Output Summary (Last 24 hours) at 05/08/16 1406 Last data filed at  05/08/16 1100  Gross per 24 hour  Intake             2580 ml  Output             1400 ml  Net             1180 ml   Filed Weights   05/05/16 1043  Weight: 112.7 kg (248 lb 6.4 oz)    Examination:  General exam: Appears calm and comfortable while lying down Respiratory system: Clear to auscultation. Respiratory effort normal. Cardiovascular system: S1 & S2 heard, RRR. No murmurs, rubs, gallops or clicks. Gastrointestinal system: Abdomen is obese. Mild lower quadrant tenderness. Abdomen is soft. Normal bowel sounds heard. Central nervous system: Alert and oriented. No focal neurological deficits. Extremities: No edema. No calf tenderness Skin: No cyanosis. No rashes Psychiatry: Judgement and insight appear normal. Mood & affect appropriate.     Data Reviewed: I have personally reviewed following labs and imaging studies  CBC:  Recent Labs Lab 05/05/16 0040 05/05/16 0322 05/06/16 0502 05/07/16 0526 05/08/16 0542  WBC 15.4* 14.7* 10.6*  10.4 7.9 5.8  NEUTROABS 13.9*  --   --   --   --   HGB 10.6* 9.8* 10.0*  10.0* 9.5* 9.9*  HCT 35.4* 32.8* 33.6*  33.6* 31.5* 33.0*  MCV 85.5 85.9 86.8  87.0 86.1 85.5  PLT 217 209 218  204 195 202   Basic Metabolic Panel:  Recent Labs Lab 05/04/16 1646 05/05/16 0322 05/06/16 0502 05/07/16 0526 05/08/16 0542  NA 137 136 135 137 137  K 3.1* 4.1 3.7 3.6  3.4*  CL 104 105 103 104 104  CO2 23 23 26 27 25   GLUCOSE 126* 139* 130* 141* 122*  BUN 8 7 <5* <5* <5*  CREATININE 0.77 0.74 0.77 0.70 0.58  CALCIUM 8.7* 7.8* 7.8* 8.0* 8.3*  MG  --  1.5*  --  1.9  --    GFR: Estimated Creatinine Clearance: 107.7 mL/min (by C-G formula based on SCr of 0.58 mg/dL). Liver Function Tests:  Recent Labs Lab 05/04/16 1646  AST 16  ALT 14  ALKPHOS 81  BILITOT 0.4  PROT 6.7  ALBUMIN 4.1    Recent Labs Lab 05/04/16 1646  LIPASE 27   No results for input(s): AMMONIA in the last 168 hours. Coagulation Profile:  Recent Labs Lab  05/05/16 0040  INR 1.11   Cardiac Enzymes: No results for input(s): CKTOTAL, CKMB, CKMBINDEX, TROPONINI in the last 168 hours. BNP (last 3 results) No results for input(s): PROBNP in the last 8760 hours. HbA1C: No results for input(s): HGBA1C in the last 72 hours. CBG:  Recent Labs Lab 05/05/16 0806 05/06/16 0812 05/07/16 0743 05/08/16 0741  GLUCAP 118* 115* 101* 117*   Lipid Profile: No results for input(s): CHOL, HDL, LDLCALC, TRIG, CHOLHDL, LDLDIRECT in the last 72 hours. Thyroid Function Tests: No results for input(s): TSH, T4TOTAL, FREET4, T3FREE, THYROIDAB in the last 72 hours. Anemia Panel:  Recent Labs  05/07/16 0805  FERRITIN 55  TIBC 274  IRON 13*   Sepsis Labs:  Recent Labs Lab 05/05/16 0040 05/05/16 0105 05/05/16 0322  PROCALCITON 0.40  --   --   LATICACIDVEN 2.3* 2.35* 1.9    Recent Results (from the past 240 hour(s))  Culture, blood (x 2)     Status: None (Preliminary result)   Collection Time: 05/05/16 12:40 AM  Result Value Ref Range Status   Specimen Description BLOOD RIGHT ARM  Final   Special Requests BOTTLES DRAWN AEROBIC AND ANAEROBIC 5ML  Final   Culture NO GROWTH 3 DAYS  Final   Report Status PENDING  Incomplete  Culture, blood (x 2)     Status: None (Preliminary result)   Collection Time: 05/05/16 12:45 AM  Result Value Ref Range Status   Specimen Description BLOOD RIGHT HAND  Final   Special Requests BOTTLES DRAWN AEROBIC AND ANAEROBIC 5ML  Final   Culture NO GROWTH 3 DAYS  Final   Report Status PENDING  Incomplete         Radiology Studies: No results found.      Scheduled Meds: . enoxaparin (LOVENOX) injection  0.5 mg/kg Subcutaneous Daily  . piperacillin-tazobactam (ZOSYN)  IV  3.375 g Intravenous Q8H   Continuous Infusions:     LOS: 3 days     Jacquelin HawkingRalph Halford Goetzke [facility] 05/08/2016, 2:06 PM Pager: [phone]  If 7PM-7AM, please contact night-coverage [email or web address] Password [email or web address] 05/08/2016,  2:06 PM

## 2016-05-08 NOTE — Progress Notes (Signed)
  Subjective: Slept well. No more n/v other than yesterday am. +flatus. Not much abd pain  Objective: Vital signs in last 24 hours: Temp:  [98 F (36.7 C)-98.8 F (37.1 C)] 98 F (36.7 C) (10/10 0510) Pulse Rate:  [83-89] 83 (10/10 0510) Resp:  [15-18] 15 (10/10 0510) BP: (145-159)/(71-97) 147/81 (10/10 0510) SpO2:  [96 %-100 %] 96 % (10/10 0510) Last BM Date: 05/04/16  Intake/Output from previous day: 10/09 0701 - 10/10 0700 In: 2802 [P.O.:402; I.V.:2300; IV Piggyback:100] Out: 700 [Urine:700] Intake/Output this shift: No intake/output data recorded.  Alert, approp, nontoxic cta Reg Obese, soft, nd, not really TTP.  Lab Results:   Recent Labs  05/07/16 0526 05/08/16 0542  WBC 7.9 5.8  HGB 9.5* 9.9*  HCT 31.5* 33.0*  PLT 195 202   BMET  Recent Labs  05/07/16 0526 05/08/16 0542  NA 137 137  K 3.6 3.4*  CL 104 104  CO2 27 25  GLUCOSE 141* 122*  BUN <5* <5*  CREATININE 0.70 0.58  CALCIUM 8.0* 8.3*   PT/INR No results for input(s): LABPROT, INR in the last 72 hours. ABG No results for input(s): PHART, HCO3 in the last 72 hours.  Invalid input(s): PCO2, PO2  Studies/Results: No results found.  Anti-infectives: Anti-infectives    Start     Dose/Rate Route Frequency Ordered Stop   05/05/16 0600  piperacillin-tazobactam (ZOSYN) IVPB 3.375 g     3.375 g 12.5 mL/hr over 240 Minutes Intravenous Every 8 hours 05/05/16 0025     05/04/16 2345  piperacillin-tazobactam (ZOSYN) IVPB 3.375 g     3.375 g 100 mL/hr over 30 Minutes Intravenous  Once 05/04/16 2339 05/05/16 0121      Assessment/Plan: Sigmoid diverticulitis with perforation  No fever, no tachycardia, wbc normal, nontender essentially Adv diet to fulls today Cont IV abx today Home in next 24-48 hrs if continues to do well Chemical vte prophylaxis  Stacy SellaEric Harris. Andrey CampanileWilson, MD, FACS General, Bariatric, & Minimally Invasive Surgery [facility] [place]   LOS: 3 days    Stacy Harris,Stacy Moening  Harris 05/08/2016

## 2016-05-08 NOTE — Progress Notes (Signed)
Pharmacy Antibiotic Note  Stacy Harris is a 50 y.o. female admitted on 05/04/2016 with Sigmoid diverticulitis with perforation.  Pharmacy consulted for Zosyn dosing. Advancing diet today. WBC wnl, afeb  Plan: Continue Zosyn 3.375g IV q8h (4 hour infusion). Follow up length of treatment   Height: 5\' 5"  (165.1 cm) Weight: 248 lb 6.4 oz (112.7 kg) IBW/kg (Calculated) : 57  Temp (24hrs), Avg:98.4 F (36.9 C), Min:98 F (36.7 C), Max:98.8 F (37.1 C)   Recent Labs Lab 05/04/16 1646 05/05/16 0040 05/05/16 0105 05/05/16 0322 05/06/16 0502 05/07/16 0526 05/08/16 0542  WBC 14.4* 15.4*  --  14.7* 10.6*  10.4 7.9 5.8  CREATININE 0.77  --   --  0.74 0.77 0.70 0.58  LATICACIDVEN  --  2.3* 2.35* 1.9  --   --   --      Allergies  Allergen Reactions  . Codeine Rash     Thank you for allowing us to participate in this patients care. Signe Coltonya C Emelie Newsom, PharmD Pager: [phone] 05/08/2016 11:22 AM

## 2016-05-09 DIAGNOSIS — K5720 Diverticulitis of large intestine with perforation and abscess without bleeding: Secondary | ICD-10-CM

## 2016-05-09 LAB — GLUCOSE, CAPILLARY: Glucose-Capillary: 104 mg/dL — ABNORMAL HIGH (ref 65–99)

## 2016-05-09 LAB — CBC WITH DIFFERENTIAL/PLATELET
BASOS ABS: 0 10*3/uL (ref 0.0–0.1)
BASOS PCT: 0 %
Eosinophils Absolute: 0.1 10*3/uL (ref 0.0–0.7)
Eosinophils Relative: 1 %
HEMATOCRIT: 33.1 % — AB (ref 36.0–46.0)
HEMOGLOBIN: 10.1 g/dL — AB (ref 12.0–15.0)
Lymphocytes Relative: 19 %
Lymphs Abs: 1.1 10*3/uL (ref 0.7–4.0)
MCH: 25.5 pg — ABNORMAL LOW (ref 26.0–34.0)
MCHC: 30.5 g/dL (ref 30.0–36.0)
MCV: 83.6 fL (ref 78.0–100.0)
MONOS PCT: 7 %
Monocytes Absolute: 0.4 10*3/uL (ref 0.1–1.0)
NEUTROS ABS: 4.1 10*3/uL (ref 1.7–7.7)
NEUTROS PCT: 73 %
Platelets: 224 10*3/uL (ref 150–400)
RBC: 3.96 MIL/uL (ref 3.87–5.11)
RDW: 14.5 % (ref 11.5–15.5)
WBC: 5.7 10*3/uL (ref 4.0–10.5)

## 2016-05-09 LAB — BASIC METABOLIC PANEL
ANION GAP: 9 (ref 5–15)
BUN: <5 mg/dL — ABNORMAL LOW (ref 6–20)
CALCIUM: 8.7 mg/dL — AB (ref 8.9–10.3)
CO2: 26 mmol/L (ref 22–32)
Chloride: 103 mmol/L (ref 101–111)
Creatinine, Ser: 0.71 mg/dL (ref 0.44–1.00)
GFR calc non Af Amer: >60 mL/min (ref >60)
Glucose, Bld: 104 mg/dL — ABNORMAL HIGH (ref 65–99)
Potassium: 3.6 mmol/L (ref 3.5–5.1)
Sodium: 138 mmol/L (ref 135–145)

## 2016-05-09 MED ORDER — AMOXICILLIN-POT CLAVULANATE 875-125 MG PO TABS
1.0000 | ORAL_TABLET | Freq: Two times a day (BID) | ORAL | Status: DC
Start: 2016-05-09 — End: 2016-05-09

## 2016-05-09 MED ORDER — AMOXICILLIN-POT CLAVULANATE 875-125 MG PO TABS: 1.0000 | ORAL_TABLET | Freq: Two times a day (BID) | ORAL | 0 refills | Status: AC

## 2016-05-09 MED ORDER — ONDANSETRON HCL 4 MG PO TABS: 4.0000 mg | ORAL_TABLET | Freq: Four times a day (QID) | ORAL | 0 refills | Status: DC

## 2016-05-09 NOTE — Discharge Instructions (Signed)
You were admitted for a perforation in an outpouching of your colon called diverticulitis. This is treated with antibiotics and bowel rest. You have done well, advancing your diet and are ready to go home. You should take it easy, keep taking the antibiotics as directed (complete the whole course), follow the diet below, and follow up with Dr. Dulce Sellarutlaw in 2 - 4 weeks.  Low-Fiber Diet Fiber is found in fruits, vegetables, and whole grains. A low-fiber diet restricts fibrous foods that are not digested in the small intestine. A diet containing about 10-15 grams of fiber per day is considered low fiber. Low-fiber diets may be used to:  Promote healing and rest the bowel during intestinal flare-ups.  Prevent blockage of a partially obstructed or narrowed gastrointestinal tract.  Reduce fecal weight and volume.  Slow the movement of feces. You may be on a low-fiber diet as a transitional diet following surgery, after an injury (trauma), or because of a short (acute) or lifelong (chronic) illness. Your health care provider will determine the length of time you need to stay on this diet.  WHAT DO I NEED TO KNOW ABOUT A LOW-FIBER DIET? Always check the fiber content on the packaging's Nutrition Facts label, especially on foods from the grains list. Ask your dietitian if you have questions about specific foods that are related to your condition, especially if the food is not listed below. In general, a low-fiber food will have less than 2 g of fiber. WHAT FOODS CAN I EAT? Grains All breads and crackers made with white flour. Sweet rolls, doughnuts, waffles, pancakes, [place] toast, bagels. Pretzels, Melba toast, zwieback. Well-cooked cereals, such as cornmeal, farina, or cream cereals. Dry cereals that do not contain whole grains, fruit, or nuts, such as refined corn, wheat, rice, and oat cereals. Potatoes prepared any way without skins, plain pastas and noodles, refined white rice. Use white flour for baking  and making sauces. Use allowed list of grains for casseroles, dumplings, and puddings.  Vegetables Strained tomato and vegetable juices. Fresh lettuce, cucumber, spinach. Well-cooked (no skin or pulp) or canned vegetables, such as asparagus, bean sprouts, beets, carrots, green beans, mushrooms, potatoes, pumpkin, spinach, yellow squash, tomato sauce/puree, turnips, yams, and zucchini. Keep servings limited to  cup.  Fruits All fruit juices except prune juice. Cooked or canned fruits without skin and seeds, such as applesauce, apricots, cherries, fruit cocktail, grapefruit, grapes, mandarin oranges, melons, peaches, pears, pineapple, and plums. Fresh fruits without skin, such as apricots, avocados, bananas, melons, pineapple, nectarines, and peaches. Keep servings limited to  cup or 1 piece.  Meat and Other Protein Sources Ground or well-cooked tender beef, ham, veal, lamb, pork, or poultry. Eggs, plain cheese. Fish, oysters, shrimp, lobster, and other seafood. Liver, organ meats. Smooth nut butters. Dairy All milk products and alternative dairy substitutes, such as soy, rice, almond, and coconut, not containing added whole nuts, seeds, or added fruit. Beverages Decaf coffee, fruit, and vegetable juices or smoothies (small amounts, with no pulp or skins, and with fruits from allowed list), sports drinks, herbal tea. Condiments Ketchup, mustard, vinegar, cream sauce, cheese sauce, cocoa powder. Spices in moderation, such as allspice, basil, bay leaves, celery powder or leaves, cinnamon, cumin powder, curry powder, ginger, mace, marjoram, onion or garlic powder, oregano, paprika, parsley flakes, ground pepper, rosemary, sage, savory, tarragon, thyme, and turmeric. Sweets and Desserts Plain cakes and cookies, pie made with allowed fruit, pudding, custard, cream pie. Gelatin, fruit, ice, sherbet, frozen ice pops. Ice cream, ice  milk without nuts. Plain hard candy, honey, jelly, molasses, syrup, sugar,  chocolate syrup, gumdrops, marshmallows. Limit overall sugar intake.  Fats and Oil Margarine, butter, cream, mayonnaise, salad oils, plain salad dressings made from allowed foods. Choose healthy fats such as olive oil, canola oil, and omega-3 fatty acids (such as found in salmon or tuna) when possible.  Other Bouillon, broth, or cream soups made from allowed foods. Any strained soup. Casseroles or mixed dishes made with allowed foods. The items listed above may not be a complete list of recommended foods or beverages. Contact your dietitian for more options.  WHAT FOODS ARE NOT RECOMMENDED? Grains All whole wheat and whole grain breads and crackers. Multigrains, rye, bran seeds, nuts, or coconut. Cereals containing whole grains, multigrains, bran, coconut, nuts, raisins. Cooked or dry oatmeal, steel-cut oats. Coarse wheat cereals, granola. Cereals advertised as high fiber. Potato skins. Whole grain pasta, wild or brown rice. Popcorn. Coconut flour. Bran, buckwheat, corn bread, multigrains, rye, wheat germ.  Vegetables Fresh, cooked or canned vegetables, such as artichokes, asparagus, beet greens, broccoli, Brussels sprouts, cabbage, celery, cauliflower, corn, eggplant, kale, legumes or beans, okra, peas, and tomatoes. Avoid large servings of any vegetables, especially raw vegetables.  Fruits Fresh fruits, such as apples with or without skin, berries, cherries, figs, grapes, grapefruit, guavas, kiwis, mangoes, oranges, papayas, pears, persimmons, pineapple, and pomegranate. Prune juice and juices with pulp, stewed or dried prunes. Dried fruits, dates, raisins. Fruit seeds or skins. Avoid large servings of all fresh fruits. Meats and Other Protein Sources Tough, fibrous meats with gristle. Chunky nut butter. Cheese made with seeds, nuts, or other foods not recommended. Nuts, seeds, legumes (beans, including baked beans), dried peas, beans, lentils.  Dairy Yogurt or cheese that contains nuts, seeds, or  added fruit.  Beverages Fruit juices with high pulp, prune juice. Caffeinated coffee and teas.  Condiments Coconut, maple syrup, pickles, olives. Sweets and Desserts Desserts, cookies, or candies that contain nuts or coconut, chunky peanut butter, dried fruits. Jams, preserves with seeds, marmalade. Large amounts of sugar and sweets. Any other dessert made with fruits from the not recommended list.  Other Soups made from vegetables that are not recommended or that contain other foods not recommended.  The items listed above may not be a complete list of foods and beverages to avoid. Contact your dietitian for more information.   This information is not intended to replace advice given to you by your health care provider. Make sure you discuss any questions you have with your health care provider.   Document Released: 01/05/2002 Document Revised: 07/21/2013 Document Reviewed: 06/08/2013 Elsevier Interactive Patient Education 2016 [employer].   Diverticulitis Diverticulitis is inflammation or infection of small pouches in your colon that form when you have a condition called diverticulosis. The pouches in your colon are called diverticula. Your colon, or large intestine, is where water is absorbed and stool is formed. Complications of diverticulitis can include:  Bleeding.  Severe infection.  Severe pain.  Perforation of your colon.  Obstruction of your colon. CAUSES  Diverticulitis is caused by bacteria. Diverticulitis happens when stool becomes trapped in diverticula. This allows bacteria to grow in the diverticula, which can lead to inflammation and infection. RISK FACTORS People with diverticulosis are at risk for diverticulitis. Eating a diet that does not include enough fiber from fruits and vegetables may make diverticulitis more likely to develop. SYMPTOMS  Symptoms of diverticulitis may include:  Abdominal pain and tenderness. The pain is normally located on the  left  side of the abdomen, but may occur in other areas.  Fever and chills.  Bloating.  Cramping.  Nausea.  Vomiting.  Constipation.  Diarrhea.  Blood in your stool. DIAGNOSIS  Your health care provider will ask you about your medical history and do a physical exam. You may need to have tests done because many medical conditions can cause the same symptoms as diverticulitis. Tests may include:  Blood tests.  Urine tests.  Imaging tests of the abdomen, including X-rays and CT scans. When your condition is under control, your health care provider may recommend that you have a colonoscopy. A colonoscopy can show how severe your diverticula are and whether something else is causing your symptoms. TREATMENT  Most cases of diverticulitis are mild and can be treated at home. Treatment may include:  Taking over-the-counter pain medicines.  Following a clear liquid diet.  Taking antibiotic medicines by mouth for 7-10 days. More severe cases may be treated at a hospital. Treatment may include:  Not eating or drinking.  Taking prescription pain medicine.  Receiving antibiotic medicines through an IV tube.  Receiving fluids and nutrition through an IV tube.  Surgery. HOME CARE INSTRUCTIONS   Follow your health care provider's instructions carefully.  Follow a full liquid diet or other diet as directed by your health care provider. After your symptoms improve, your health care provider may tell you to change your diet. He or she may recommend you eat a high-fiber diet. Fruits and vegetables are good sources of fiber. Fiber makes it easier to pass stool.  Take fiber supplements or probiotics as directed by your health care provider.  Only take medicines as directed by your health care provider.  Keep all your follow-up appointments. SEEK MEDICAL CARE IF:   Your pain does not improve.  You have a hard time eating food.  Your bowel movements do not return to normal. SEEK  IMMEDIATE MEDICAL CARE IF:   Your pain becomes worse.  Your symptoms do not get better.  Your symptoms suddenly get worse.  You have a fever.  You have repeated vomiting.  You have bloody or black, tarry stools. MAKE SURE YOU:   Understand these instructions.  Will watch your condition.  Will get help right away if you are not doing well or get worse.   This information is not intended to replace advice given to you by your health care provider. Make sure you discuss any questions you have with your health care provider.   Document Released: 04/25/2005 Document Revised: 07/21/2013 Document Reviewed: 06/10/2013 Elsevier Interactive Patient Education [employer]

## 2016-05-10 LAB — CULTURE, BLOOD (ROUTINE X 2)
CULTURE: NO GROWTH
CULTURE: NO GROWTH

## 2016-05-10 NOTE — Discharge Summary (Signed)
Physician Discharge Summary  Stacy Harris:[id number] DOB: 1965-08-06 DOA: 05/04/2016  PCP: Delorse Lek, MD  Admit date: 05/04/2016 Discharge date: 05/10/2016  Admitted From: Home Disposition: Home   Recommendations for Outpatient Follow-up:  1. Follow up with PCP in 1-2 weeks 2. Please obtain BMP/CBC in one week 3. Please follow up on the following pending results:  Home Health: None Equipment/Devices: None Discharge Condition: Stable CODE STATUS: Full Diet recommendation: High fiber  Brief/Interim Summary: Stacy Harris is a 50 y.o. female with a history of hypertension, high deficiency anemia, arthritis, remote seizure she was sent from PCP with abdominal pain found to be due to diverticulitis with perforation. Surgery was consulted and patient started on antibiotics. Fortunately, surgery was not required. Her vital signs and clinical picture improved. Antibiotics were transitioned to po augmentin and she was discharged with recommendations regarding diet and follow up. GI, Dr. Dulce Sellar, will see her for evaluation with colonoscopy as an outpatient.   Discharge Diagnoses:  Principal Problem:   Diverticulitis of large intestine with perforation without abscess or bleeding Active Problems:   Essential hypertension   Sepsis (HCC)   Iron deficiency anemia  Discharge Instructions   You were admitted for a perforation in an outpouching of your colon called diverticulitis. This is treated with antibiotics and bowel rest. You have done well, advancing your diet and are ready to go home. You should take it easy, keep taking the antibiotics as directed (complete the whole course), follow the diet below, and follow up with Dr. Dulce Sellar in 2 - 4 weeks.     Medication List    TAKE these medications   amoxicillin-clavulanate 875-125 MG tablet Commonly known as:  AUGMENTIN Take 1 tablet by mouth 2 (two) times daily.   aspirin-acetaminophen-caffeine 250-250-65 MG tablet Commonly known  as:  EXCEDRIN MIGRAINE Take 2 tablets by mouth every 6 (six) hours as needed for migraine.   guaiFENesin 600 MG 12 hr tablet Commonly known as:  MUCINEX Take 600 mg by mouth 2 (two) times daily as needed for to loosen phlegm.   HYDROcodone-acetaminophen 10-325 MG tablet Commonly known as:  NORCO Take 1-2 tablets by mouth every 4 (four) hours as needed.   ibuprofen 200 MG tablet Commonly known as:  ADVIL,MOTRIN Take 400 mg by mouth every 6 (six) hours as needed for moderate pain.   Iron 325 (65 Fe) MG Tabs Take 1 tablet by mouth daily.   lisinopril 10 MG tablet Commonly known as:  PRINIVIL,ZESTRIL Take 10 mg by mouth daily.   multivitamin with minerals Tabs tablet Take 1 tablet by mouth daily.   ondansetron 4 MG tablet Commonly known as:  ZOFRAN Take 1 tablet (4 mg total) by mouth every 6 (six) hours.      Follow-up Information    Delorse Lek, MD .   Specialty:  Family Medicine Contact information: [address] [place] [address] [phone]        Freddy Jaksch, MD. Schedule an appointment as soon as possible for a visit in 2 week(s).   Specialty:  Gastroenterology Why:   Will call patient on 05/10/2016 to make an appointment.   Contact information: [address] [place] [address] [phone]          Allergies  Allergen Reactions  . Codeine Rash    Consultations:  General surgery, Dr. Andrey Campanile  Procedures/Studies: Ct Abdomen Pelvis W Contrast  Result Date: 05/04/2016 CLINICAL DATA:  Lower abdominal and back  pain with sudden onset 10 hours ago. EXAM: CT ABDOMEN AND PELVIS WITH CONTRAST TECHNIQUE: Multidetector CT imaging of the abdomen and pelvis was performed using the standard protocol following bolus administration of intravenous contrast. CONTRAST:  100mL ISOVUE-300 IOPAMIDOL (ISOVUE-300) INJECTION 61% COMPARISON:  None. FINDINGS: Lower chest: No acute abnormality.  Hypoventilatory changes.  Hepatobiliary: No focal liver abnormality is seen. No gallstones, gallbladder wall thickening, or biliary dilatation. Pancreas: Unremarkable. No pancreatic ductal dilatation or surrounding inflammatory changes. Spleen: Normal in size without focal abnormality. Adrenals/Urinary Tract: Normal adrenal glands. No evidence of obstructive uropathy or nephrolithiasis. Bilateral too small to be accurately characterized hypoattenuated lesions, which statistically likely represent cysts. Stomach/Bowel: Stomach is within normal limits. Appendix appears normal. There is a short segment of irregular mucosal thickening of the distal sigmoid colon/proximal rectum with extensive pericolonic mesenteric stranding. Few small diverticula are seen in the area. Several tiny extraluminal gas bubbles are also present in the nearby mesentery. Smooth symmetric mucosal thickening is seen of the downstream rectum. Vascular/Lymphatic: No significant vascular findings are present. No enlarged abdominal or pelvic lymph nodes. Reproductive: Uterus and bilateral adnexa are unremarkable. Other: No abdominal wall hernia or abnormality. Small amount of free fluid in the pelvis, likely inflammatory. Musculoskeletal: No acute or significant osseous findings. IMPRESSION: Abnormal appearance of the distal sigmoid colon/ proximal rectum with area of focal asymmetric mucosal thickening and pericolonic inflammatory changes. This may represent a focal acute diverticulitis. Few extraluminal gas bubbles in the nearby mesentery suggest micro rupture. Small amount of free pelvic fluid. No evidence of drainable abscess formation. Long segment of smooth mucosal thickening of the rectum, likely inflammatory. Correlation to colonoscopy after resolution of patient's acute symptoms is recommended to exclude underlying malignancy. Electronically Signed   By: Ted Mcalpineobrinka  Dimitrova M.D.   On: 05/04/2016 23:23   Dg Chest Port 1 View  Result Date: 05/05/2016 CLINICAL  DATA:  Acute onset of fever. Recent acute diverticulitis with microperforation. EXAM: PORTABLE CHEST 1 VIEW COMPARISON:  Initial encounter. FINDINGS: The lungs are well-aerated and clear. There is no evidence of focal opacification, pleural effusion or pneumothorax. The cardiomediastinal silhouette is within normal limits. No acute osseous abnormalities are seen. IMPRESSION: No acute cardiopulmonary process seen. Electronically Signed   By: Roanna RaiderJeffery  Chang M.D.   On: 05/05/2016 01:21      Subjective: Pt feels well, tolerated soft diet. No N/V/D. + flatus. Abd pain significantly improved.   Discharge Exam: Vitals:   05/09/16 0539 05/09/16 1416  BP: 138/71 131/67  Pulse: 76 86  Resp: 16 16  Temp: 99.4 F (37.4 C) 98.4 F (36.9 C)   Vitals:   05/08/16 1518 05/08/16 2210 05/09/16 0539 05/09/16 1416  BP: (!) 168/89 (!) 159/92 138/71 131/67  Pulse: 77 84 76 86  Resp: 15 17 16 16   Temp: 98.7 F (37.1 C) 99.8 F (37.7 C) 99.4 F (37.4 C) 98.4 F (36.9 C)  TempSrc: Oral Oral Oral Oral  SpO2: 100% 99% 96% 95%  Weight:      Height:       General: Pt is alert, awake, not in acute distress Cardiovascular: RRR, S1/S2 +, no rubs, no gallops Respiratory: CTA bilaterally, no wheezing, no rhonchi Abdominal: Soft, NT, ND, bowel sounds + Extremities: no edema, no cyanosis  The results of significant diagnostics from this hospitalization (including imaging, microbiology, ancillary and laboratory) are listed below for reference.    Microbiology: Recent Results (from the past 240 hour(s))  Culture, blood (x 2)     Status:  None   Collection Time: 05/05/16 12:40 AM  Result Value Ref Range Status   Specimen Description BLOOD RIGHT ARM  Final   Special Requests BOTTLES DRAWN AEROBIC AND ANAEROBIC  Final   Culture NO GROWTH 5 DAYS  Final   Report Status 05/10/2016 FINAL  Final  Culture, blood (x 2)     Status: None   Collection Time: 05/05/16 12:45 AM  Result Value Ref Range Status    Specimen Description BLOOD RIGHT HAND  Final   Special Requests BOTTLES DRAWN AEROBIC AND ANAEROBIC  Final   Culture NO GROWTH 5 DAYS  Final   Report Status 05/10/2016 FINAL  Final     Labs: BNP (last 3 results) No results for input(s): BNP in the last 8760 hours. Basic Metabolic Panel:  Recent Labs Lab 05/05/16 0322 05/06/16 0502 05/07/16 0526 05/08/16 0542 05/09/16 0520  NA 136 135 137 137 138  K 4.1 3.7 3.6 3.4* 3.6  CL 105 103 104 104 103  CO2 23 26 27 25 26   GLUCOSE 139* 130* 141* 122* 104*  BUN 7 <5* <5* <5* <5*  CREATININE 0.74 0.77 0.70 0.58 0.71  CALCIUM 7.8* 7.8* 8.0* 8.3* 8.7*  MG 1.5*  --  1.9  --   --    Liver Function Tests:  Recent Labs Lab 05/04/16 1646  AST 16  ALT 14  ALKPHOS 81  BILITOT 0.4  PROT 6.7  ALBUMIN 4.1    Recent Labs Lab 05/04/16 1646  LIPASE 27   No results for input(s): AMMONIA in the last 168 hours. CBC:  Recent Labs Lab 05/05/16 0040 05/05/16 0322 05/06/16 0502 05/07/16 0526 05/08/16 0542 05/09/16 0520  WBC 15.4* 14.7* 10.6*  10.4 7.9 5.8 5.7  NEUTROABS 13.9*  --   --   --   --  4.1  HGB 10.6* 9.8* 10.0*  10.0* 9.5* 9.9* 10.1*  HCT 35.4* 32.8* 33.6*  33.6* 31.5* 33.0* 33.1*  MCV 85.5 85.9 86.8  87.0 86.1 85.5 83.6  PLT 217 209 218  204 195 202 224   Cardiac Enzymes: No results for input(s): CKTOTAL, CKMB, CKMBINDEX, TROPONINI in the last 168 hours. BNP: Invalid input(s): POCBNP CBG:  Recent Labs Lab 05/05/16 0806 05/06/16 0812 05/07/16 0743 05/08/16 0741 05/09/16 0804  GLUCAP 118* 115* 101* 117* 104*   D-Dimer No results for input(s): DDIMER in the last 72 hours. Hgb A1c No results for input(s): HGBA1C in the last 72 hours. Lipid Profile No results for input(s): CHOL, HDL, LDLCALC, TRIG, CHOLHDL, LDLDIRECT in the last 72 hours. Thyroid function studies No results for input(s): TSH, T4TOTAL, T3FREE, THYROIDAB in the last 72 hours.  Invalid input(s): FREET3 Anemia work up No results for  input(s): VITAMINB12, FOLATE, FERRITIN, TIBC, IRON, RETICCTPCT in the last 72 hours. Urinalysis    Component Value Date/Time   COLORURINE YELLOW 05/04/2016 1948   APPEARANCEUR CLEAR 05/04/2016 1948   LABSPEC 1.025 05/04/2016 1948   PHURINE 6.5 05/04/2016 1948   GLUCOSEU NEGATIVE 05/04/2016 1948   HGBUR TRACE (A) 05/04/2016 1948   BILIRUBINUR NEGATIVE 05/04/2016 1948   KETONESUR 15 (A) 05/04/2016 1948   PROTEINUR NEGATIVE 05/04/2016 1948   NITRITE NEGATIVE 05/04/2016 1948   LEUKOCYTESUR NEGATIVE 05/04/2016 1948   Sepsis Labs Invalid input(s): PROCALCITONIN,  WBC,  LACTICIDVEN Microbiology Recent Results (from the past 240 hour(s))  Culture, blood (x 2)     Status: None   Collection Time: 05/05/16 12:40 AM  Result Value Ref Range Status   Specimen  Description BLOOD RIGHT ARM  Final   Special Requests BOTTLES DRAWN AEROBIC AND ANAEROBIC  Final   Culture NO GROWTH 5 DAYS  Final   Report Status 05/10/2016 FINAL  Final  Culture, blood (x 2)     Status: None   Collection Time: 05/05/16 12:45 AM  Result Value Ref Range Status   Specimen Description BLOOD RIGHT HAND  Final   Special Requests BOTTLES DRAWN AEROBIC AND ANAEROBIC  Final   Culture NO GROWTH 5 DAYS  Final   Report Status 05/10/2016 FINAL  Final    Time coordinating discharge: Over 30 minutes  Hazeline Junker, MD  [facility] 05/10/2016, 6:32 PM Pager [phone]  If 7PM-7AM, please contact night-coverage [email or web address] Password [email or web address]

## 2016-06-06 ENCOUNTER — Other Ambulatory Visit: Payer: Self-pay | Admitting: Gastroenterology

## 2016-06-06 DIAGNOSIS — K5720 Diverticulitis of large intestine with perforation and abscess without bleeding: Secondary | ICD-10-CM

## 2016-06-25 ENCOUNTER — Ambulatory Visit
Admission: RE | Admit: 2016-06-25 | Discharge: 2016-06-25 | Disposition: A | Discharge status: Home / Self Care | Payer: 59 | Source: Ambulatory Visit | Attending: Gastroenterology | Admitting: Gastroenterology

## 2016-06-25 DIAGNOSIS — K5720 Diverticulitis of large intestine with perforation and abscess without bleeding: Secondary | ICD-10-CM

## 2016-06-25 MED ORDER — IOPAMIDOL (ISOVUE-300) INJECTION 61%
100.0000 mL | Freq: Once | INTRAVENOUS | Status: AC | PRN
  Administered 2016-06-25: 100 mL via INTRAVENOUS

## 2016-09-19 ENCOUNTER — Emergency Department (HOSPITAL_COMMUNITY): Payer: 59

## 2016-09-19 ENCOUNTER — Emergency Department (HOSPITAL_COMMUNITY)
Admission: EM | Admit: 2016-09-19 | Discharge: 2016-09-19 | Disposition: A | Discharge status: Home / Self Care | Payer: 59 | Attending: Emergency Medicine | Admitting: Emergency Medicine

## 2016-09-19 ENCOUNTER — Encounter (HOSPITAL_COMMUNITY): Payer: Self-pay | Admitting: Emergency Medicine

## 2016-09-19 DIAGNOSIS — Z7982 Long term (current) use of aspirin: Secondary | ICD-10-CM | POA: Diagnosis not present

## 2016-09-19 DIAGNOSIS — I10 Essential (primary) hypertension: Secondary | ICD-10-CM | POA: Diagnosis not present

## 2016-09-19 DIAGNOSIS — R0789 Other chest pain: Secondary | ICD-10-CM | POA: Insufficient documentation

## 2016-09-19 DIAGNOSIS — R072 Precordial pain: Secondary | ICD-10-CM | POA: Diagnosis present

## 2016-09-19 HISTORY — DX: Migraine, unspecified, not intractable, without status migrainosus: G43.909

## 2016-09-19 LAB — CBC
HCT: 31.1 % — ABNORMAL LOW (ref 36.0–46.0)
HEMOGLOBIN: 9.1 g/dL — AB (ref 12.0–15.0)
MCH: 23.2 pg — AB (ref 26.0–34.0)
MCHC: 29.3 g/dL — ABNORMAL LOW (ref 30.0–36.0)
MCV: 79.1 fL (ref 78.0–100.0)
Platelets: 261 10*3/uL (ref 150–400)
RBC: 3.93 MIL/uL (ref 3.87–5.11)
RDW: 14.9 % (ref 11.5–15.5)
WBC: 4.1 10*3/uL (ref 4.0–10.5)

## 2016-09-19 LAB — BASIC METABOLIC PANEL
ANION GAP: 7 (ref 5–15)
BUN: 11 mg/dL (ref 6–20)
CHLORIDE: 105 mmol/L (ref 101–111)
CO2: 27 mmol/L (ref 22–32)
Calcium: 9 mg/dL (ref 8.9–10.3)
Creatinine, Ser: 0.78 mg/dL (ref 0.44–1.00)
GFR calc non Af Amer: >60 mL/min (ref >60)
Glucose, Bld: 116 mg/dL — ABNORMAL HIGH (ref 65–99)
POTASSIUM: 3.8 mmol/L (ref 3.5–5.1)
SODIUM: 139 mmol/L (ref 135–145)

## 2016-09-19 LAB — I-STAT BETA HCG BLOOD, ED (MC, WL, AP ONLY)

## 2016-09-19 LAB — I-STAT TROPONIN, ED
TROPONIN I, POC: 0 ng/mL (ref 0.00–0.08)
Troponin i, poc: 0 ng/mL (ref 0.00–0.08)

## 2016-09-19 LAB — D-DIMER, QUANTITATIVE (NOT AT ARMC): D DIMER QUANT: 0.71 ug{FEU}/mL — AB (ref 0.00–0.50)

## 2016-09-19 MED ORDER — TRAMADOL HCL 50 MG PO TABS: 50.0000 mg | ORAL_TABLET | Freq: Four times a day (QID) | ORAL | 0 refills | Status: DC | PRN

## 2016-09-19 MED ORDER — IOPAMIDOL (ISOVUE-370) INJECTION 76%
INTRAVENOUS | Status: AC
  Administered 2016-09-19: 100 mL
  Filled 2016-09-19: qty 100

## 2016-09-19 NOTE — ED Triage Notes (Signed)
Cp that started this am started as dull and moved across chest now sharp had some  Sob ,  Had some coughing last night and vomited last night and then had a migrain

## 2016-09-19 NOTE — Discharge Instructions (Signed)
Follow-up with their family doctor next week for recheck °

## 2016-09-19 NOTE — ED Provider Notes (Signed)
[facility] DEPT Provider Note   CSN: [id number] Arrival date & time: 09/19/16  1206     History   Chief Complaint Chief Complaint  Patient presents with  . Chest Pain    HPI Stacy Harris is a 51 y.o. female.  Patient complains of substernal chest discomfort.   The history is provided by the patient. No language interpreter was used.  Chest Pain   This is a new problem. The current episode started 6 to 12 hours ago. The problem occurs constantly. The problem has not changed since onset.The pain is associated with movement. The pain is present in the substernal region. The pain is at a severity of 5/10. The pain is moderate. Pertinent negatives include no abdominal pain, no back pain, no cough and no headaches.  Pertinent negatives for past medical history include no seizures.    Past Medical History:  Diagnosis Date  . Anemia   . Arthritis    left knee  . Dysrhythmia    heart racing with hot flashes  . Epicondylitis elbow, medial   . Granular corneal dystrophy   . Headache    Migraines  . Hypertension   . Migraine   . PONV (postoperative nausea and vomiting)   . Right wrist fracture   . Seizures (HCC)    febrile sz at 9 mos  . Varicose veins     Patient Active Problem List   Diagnosis Date Noted  . Sepsis (HCC) 05/05/2016  . Iron deficiency anemia 05/05/2016  . Diverticulitis of large intestine with perforation without abscess or bleeding   . Abnormal EKG 06/16/2015  . Essential hypertension 06/16/2015    Past Surgical History:  Procedure Laterality Date  . DIAGNOSTIC LAPAROSCOPY     ovarian cyst removed  . DILATATION & CURETTAGE/HYSTEROSCOPY WITH MYOSURE N/A 06/06/2015   Procedure: DILATATION & CURETTAGE/HYSTEROSCOPY ;  Surgeon: Marlow Baars, MD;  Location: [facility];  Service: Gynecology;  Laterality: N/A;  . DILATION AND CURETTAGE OF UTERUS  1998, 2001  . LYMPH NODE DISSECTION  1985  . TENNIS ELBOW RELEASE/NIRSCHEL PROCEDURE Right 07/21/2015   Procedure:  RIGHT ELBOW DEBRIDEMENT AND TENDON REPAIR;  Surgeon: Loreta Ave, MD;  Location: [facility];  Service: Orthopedics;  Laterality: Right;  Marland Kitchen VARICOSE VEIN SURGERY  1986, 2004, 2007  . WRIST ARTHROSCOPY W/ TRIANGULAR FIBROCARTILAGE REPAIR Right 06/08/2009    OB History    No data available       Home Medications    Prior to Admission medications   Medication Sig Start Date End Date Taking? Authorizing Provider  aspirin-acetaminophen-caffeine (EXCEDRIN MIGRAINE) [phone] MG tablet Take 2 tablets by mouth every 6 (six) hours as needed for migraine.   Yes Historical Provider, MD  Ferrous Sulfate (IRON) 325 (65 FE) MG TABS Take 1 tablet by mouth daily.   Yes Historical Provider, MD  lisinopril (PRINIVIL,ZESTRIL) 10 MG tablet Take 10 mg by mouth daily.   Yes Historical Provider, MD  Multiple Vitamin (MULTIVITAMIN WITH MINERALS) TABS tablet Take 1 tablet by mouth daily.   Yes Historical Provider, MD  HYDROcodone-acetaminophen (NORCO) 10-325 MG tablet Take 1-2 tablets by mouth every 4 (four) hours as needed. Patient not taking: Reported on 05/04/2016 07/21/15   Cristie Hem, PA-C  ondansetron (ZOFRAN) 4 MG tablet Take 1 tablet (4 mg total) by mouth every 6 (six) hours. Patient not taking: Reported on 09/19/2016 05/09/16   Tyrone Nine, MD  traMADol (ULTRAM) 50 MG tablet Take 1 tablet (50 mg  total) by mouth every 6 (six) hours as needed. 09/19/16   Bethann Berkshire, MD    Family History Family History  Problem Relation Age of Onset  . Kidney disease Father   . Hypertension Father   . Lung cancer Maternal Grandmother   . Hypertension Maternal Grandfather     ? heart disease  . Kidney disease Paternal Grandmother   . Stroke Paternal Grandfather   . Hypertension Sister   . Uterine cancer Sister     Social History Social History  Substance Use Topics  . Smoking status: Never Smoker  . Smokeless tobacco: Never Used  . Alcohol use No     Allergies     Codeine   Review of Systems Review of Systems  Constitutional: Negative for appetite change and fatigue.  HENT: Negative for congestion, ear discharge and sinus pressure.   Eyes: Negative for discharge.  Respiratory: Negative for cough.   Cardiovascular: Positive for chest pain.  Gastrointestinal: Negative for abdominal pain and diarrhea.  Genitourinary: Negative for frequency and hematuria.  Musculoskeletal: Negative for back pain.  Skin: Negative for rash.  Neurological: Negative for seizures and headaches.  Psychiatric/Behavioral: Negative for hallucinations.     Physical Exam Updated Vital Signs BP 133/86   Pulse 71   Temp 98.2 F (36.8 C) (Oral)   Resp 15   SpO2 100%   Physical Exam  Constitutional: She is oriented to person, place, and time. She appears well-developed.  HENT:  Head: Normocephalic.  Eyes: Conjunctivae and EOM are normal. No scleral icterus.  Neck: Neck supple. No thyromegaly present.  Cardiovascular: Normal rate and regular rhythm.  Exam reveals no gallop and no friction rub.   No murmur heard. Pulmonary/Chest: No stridor. She has no wheezes. She has no rales. She exhibits no tenderness.  Abdominal: She exhibits no distension. There is no tenderness. There is no rebound.  Musculoskeletal: Normal range of motion. She exhibits no edema.  Lymphadenopathy:    She has no cervical adenopathy.  Neurological: She is oriented to person, place, and time. She exhibits normal muscle tone. Coordination normal.  Skin: No rash noted. No erythema.  Psychiatric: She has a normal mood and affect. Her behavior is normal.     ED Treatments / Results  Labs (all labs ordered are listed, but only abnormal results are displayed) Labs Reviewed  BASIC METABOLIC PANEL - Abnormal; Notable for the following:       Result Value   Glucose, Bld 116 (*)    All other components within normal limits  CBC - Abnormal; Notable for the following:    Hemoglobin 9.1 (*)    HCT  31.1 (*)    MCH 23.2 (*)    MCHC 29.3 (*)    All other components within normal limits  D-DIMER, QUANTITATIVE (NOT AT [facility]) - Abnormal; Notable for the following:    D-Dimer, Quant 0.71 (*)    All other components within normal limits  I-STAT TROPOININ, ED  I-STAT BETA HCG BLOOD, ED ([facility], [facility], [facility] ONLY)  I-STAT TROPOININ, ED    EKG  EKG Interpretation  Date/Time:  Wednesday September 19 2016 12:20:12 EST Ventricular Rate:  71 PR Interval:  162 QRS Duration: 74 QT Interval:  422 QTC Calculation: 458 R Axis:   26 Text Interpretation:  Normal sinus rhythm with sinus arrhythmia Normal ECG no stemi no sig change from old Confirmed by Donnald Garre, MD, Lebron Conners [phone]) on 09/19/2016 12:23:54 PM Also confirmed by Darienne Belleau  MD, Alleta Avery [phone])  on  09/19/2016 4:36:28 PM Also confirmed by Shilynn Hoch  MD, Jomarie LongsJOSEPH [phone]([id number])  on 09/19/2016 4:45:43 PM       Radiology Dg Chest 2 View  Result Date: 09/19/2016 CLINICAL DATA:  Mid chest pain starting last night EXAM: CHEST  2 VIEW COMPARISON:  05/05/2016 FINDINGS: Normal heart size and mediastinal contours. Apical artifact from EKG leads. No acute infiltrate or edema. No effusion or pneumothorax. No acute osseous findings. IMPRESSION: Negative exam. Electronically Signed   By: Marnee SpringJonathon  Watts M.D.   On: 09/19/2016 12:44   Ct Angio Chest Pe W And/or Wo Contrast  Result Date: 09/19/2016 CLINICAL DATA:  Chest pain EXAM: CT ANGIOGRAPHY CHEST WITH CONTRAST TECHNIQUE: Multidetector CT imaging of the chest was performed using the standard protocol during bolus administration of intravenous contrast. Multiplanar CT image reconstructions and MIPs were obtained to evaluate the vascular anatomy. CONTRAST:  100 cc Isovue 370 COMPARISON:  None. FINDINGS: Cardiovascular: Heart is upper normal to mildly increased. Trace pericardial effusion noted. No thoracic aortic aneurysm. No evidence for dissection in the thoracic aorta. No filling defects identified within the opacified pulmonary  arteries to suggest the presence of an acute pulmonary embolus. Mediastinum/Nodes: 13 mm short axis right paratracheal lymph node is identified on image 28 series 4. No other mediastinal lymphadenopathy. No hilar lymphadenopathy. The esophagus has normal imaging features. There is no axillary lymphadenopathy. Lungs/Pleura: Compressive atelectasis noted in the lungs dependently. No pulmonary edema or pleural effusion. No focal airspace consolidation. No suspicious pulmonary nodule or mass. Upper Abdomen: Unremarkable. Musculoskeletal: Bone windows reveal no worrisome lytic or sclerotic osseous lesions. Review of the MIP images confirms the above findings. IMPRESSION: 1. No CT evidence for acute pulmonary embolus. 2. A single borderline to mildly enlarged lymph node is identified in the right paratracheal space, indeterminate. This may well be reactive given the absence of other associated findings. Electronically Signed   By: Kennith CenterEric  Mansell M.D.   On: 09/19/2016 20:13    Procedures Procedures (including critical care time)  Medications Ordered in ED Medications  iopamidol (ISOVUE-370) 76 % injection (100 mLs  Contrast Given 09/19/16 1926)     Initial Impression / Assessment and Plan / ED Course  I have reviewed the triage vital signs and the nursing notes.  Pertinent labs & imaging results that were available during my care of the patient were reviewed by me and considered in my medical decision making (see chart for details).    Patient with chest pain. EKG and 2 troponins were unremarkable. CT of the chest was negative for PE. Doubt pain is related to  coronary artery disease. Patient will be given Ultram and will follow-up with her PCP  Final Clinical Impressions(s) / ED Diagnoses   Final diagnoses:  Other chest pain    New Prescriptions New Prescriptions   TRAMADOL (ULTRAM) 50 MG TABLET    Take 1 tablet (50 mg total) by mouth every 6 (six) hours as needed.     Bethann BerkshireJoseph Taven Strite,  MD 09/19/16 2046

## 2017-02-04 IMAGING — MR MR ELBOW*R* W/O CM
4 of 6 series · 25 of 40 positions shown · non-contrast
Comparison: 08/31/2012

CLINICAL DATA: Right elbow pain since falling down the stairs
June 2008.

EXAM:
MRI OF THE RIGHT ELBOW WITHOUT CONTRAST
TECHNIQUE: Multiplanar, multisequence MR imaging of the elbow was performed. No
intravenous contrast was administered.

[Series 7: T2 fat-sat · axial · 3.5mm · 0.55mm/px · z∈[-61,+43]mm · 8 of 24 slices shown (1 of 2)]
[im 1/24]
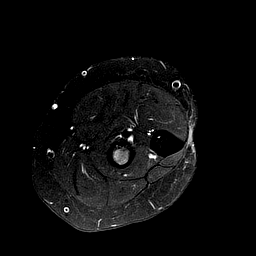
[im 4/24]
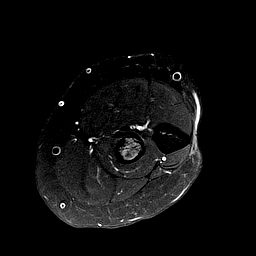
[im 7/24]
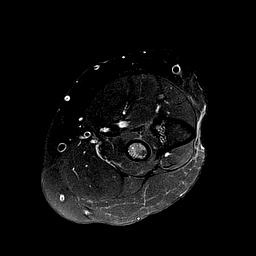
[im 10/24]
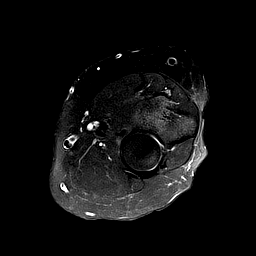
[im 14/24]
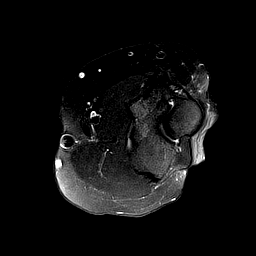
[im 17/24]
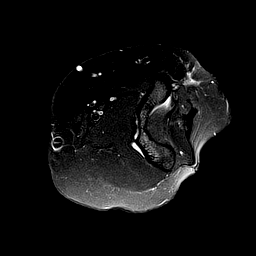
[im 20/24]
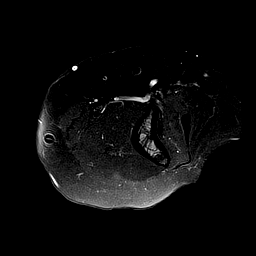
[im 24/24]
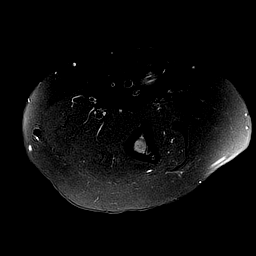

[Series 8: PD fat-sat · sagittal · 4.0mm · 0.29mm/px · 5 of 16 slices shown (1 of 2)]
[im 1/16]
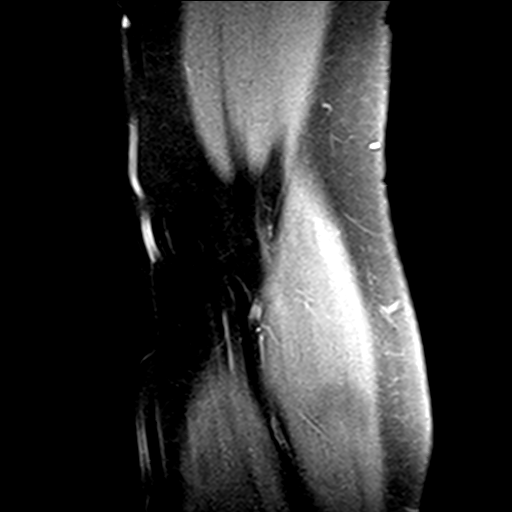
[im 4/16]
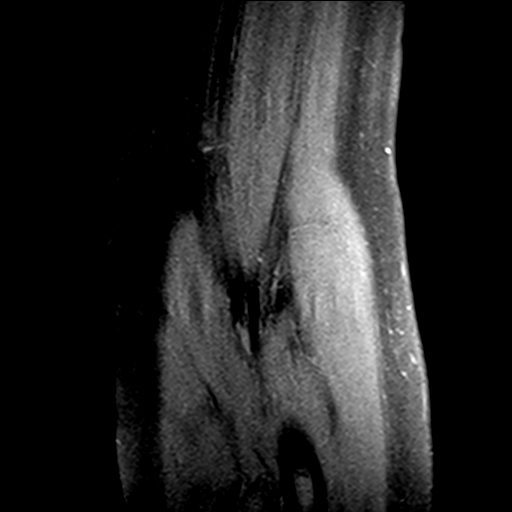
[im 8/16]
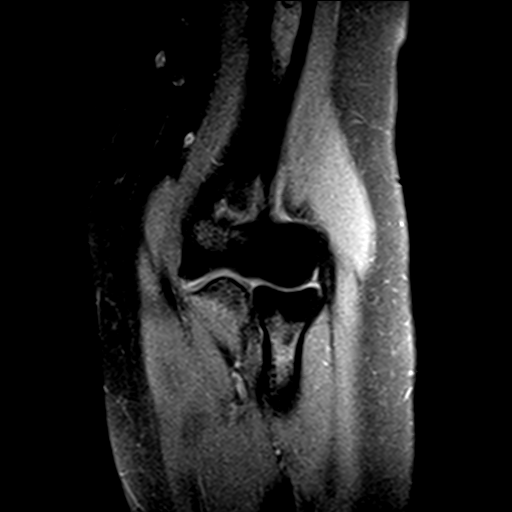
[im 12/16]
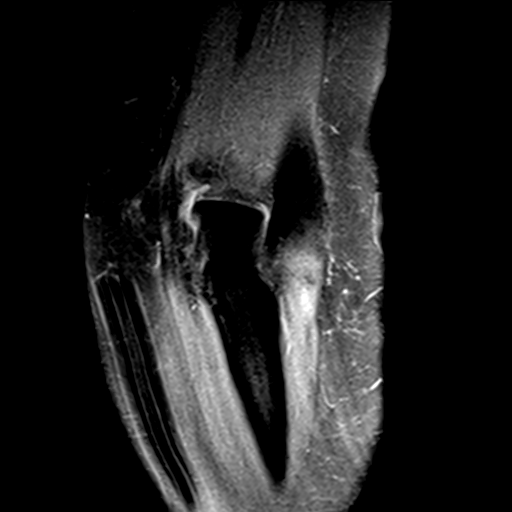
[im 16/16]
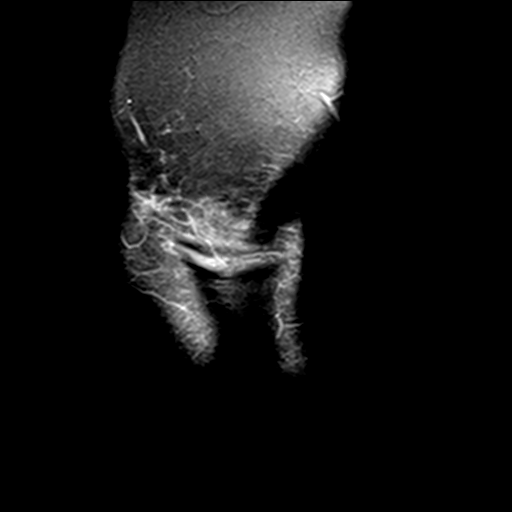

[Series 9: T2 fat-sat · sagittal · 4.0mm · 0.29mm/px · 4 of 16 slices shown (2 of 2)]
[im 1/16]
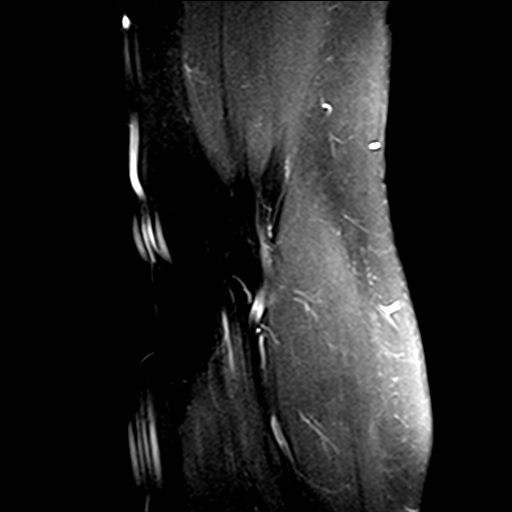
[im 4/16]
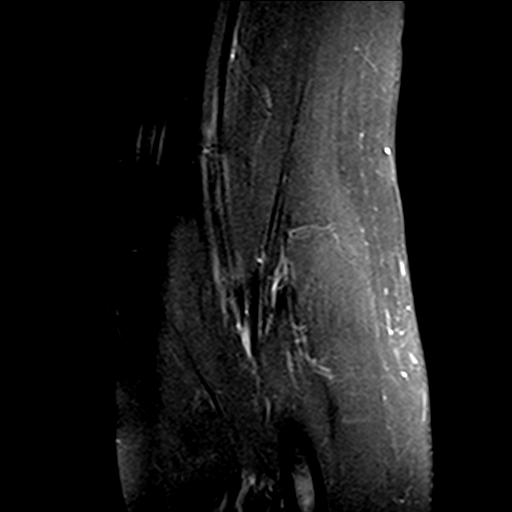
[im 8/16]
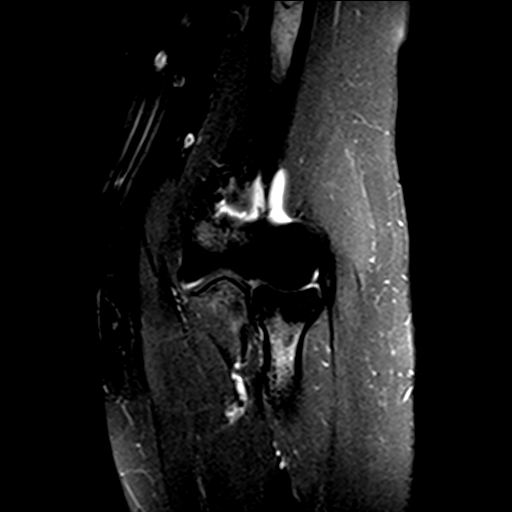
[im 16/16]
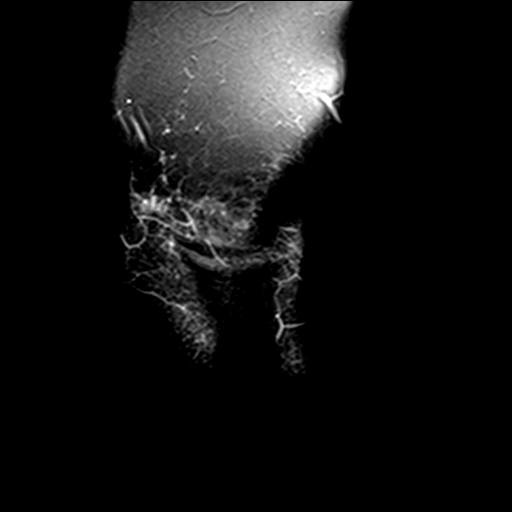

[Series 11: PD fat-sat · coronal · 3.3mm · 0.29mm/px · 8 of 24 slices shown (2 of 2)]
[im 1/24]
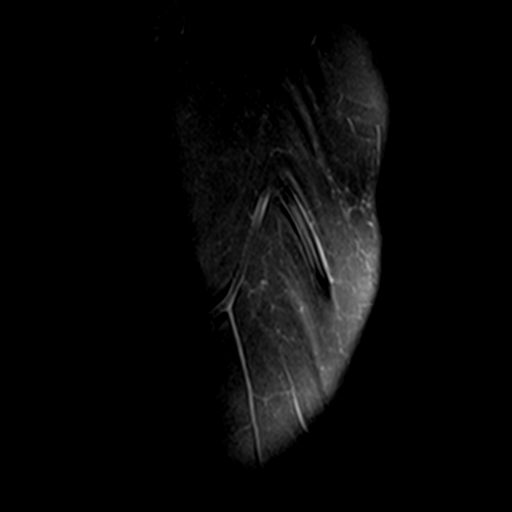
[im 4/24]
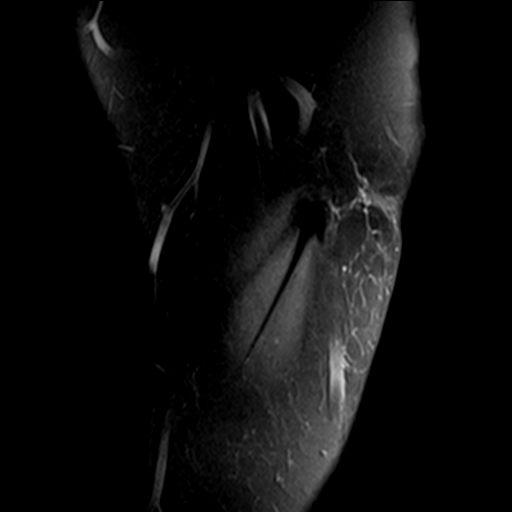
[im 7/24]
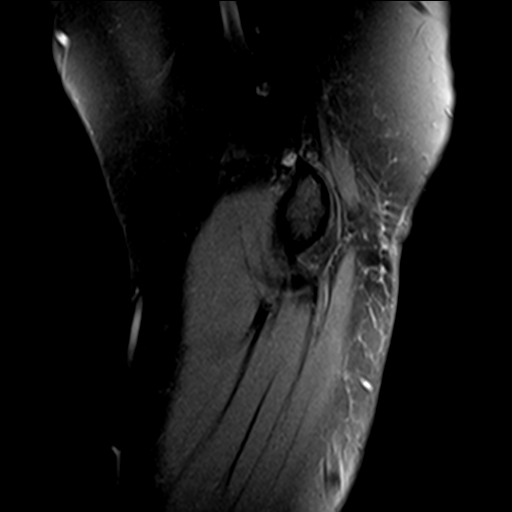
[im 10/24]
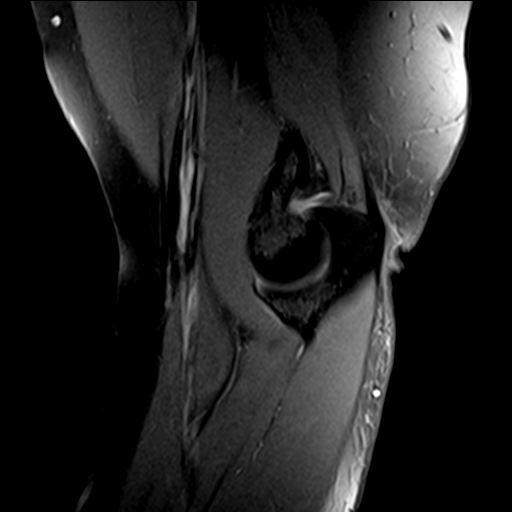
[im 14/24]
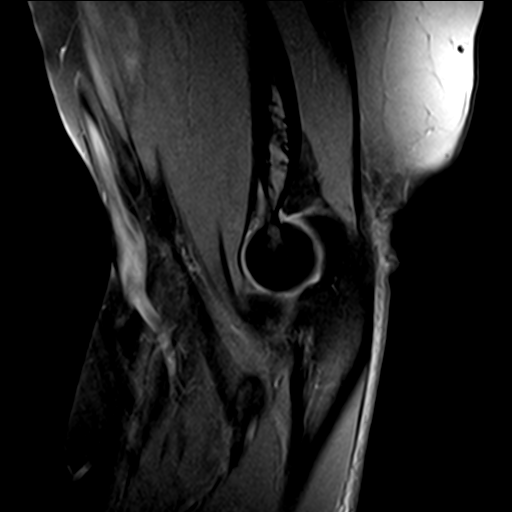
[im 17/24]
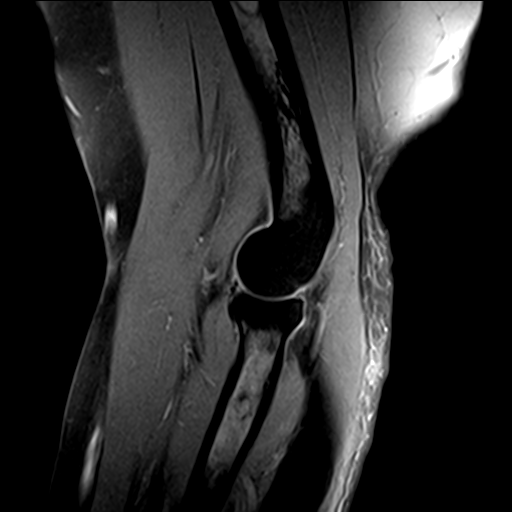
[im 20/24]
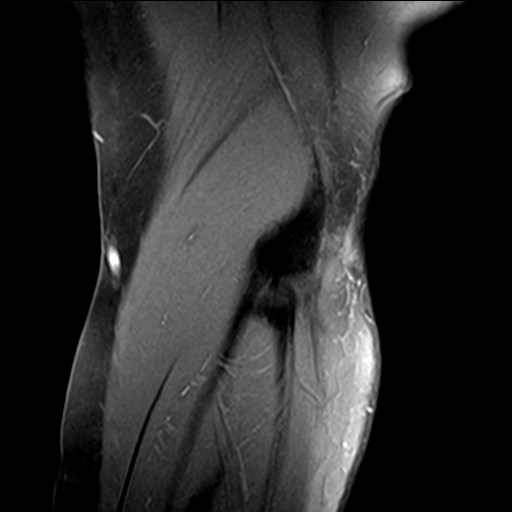
[im 24/24]
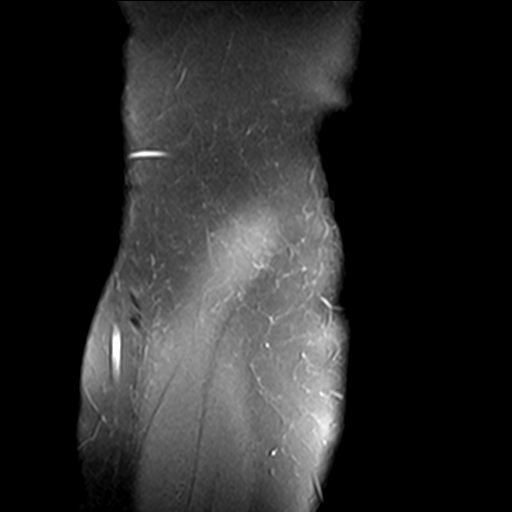

[25 of 40 positions shown; findings below may reference images not displayed]

FINDINGS: TENDONS

Common forearm flexor origin: Intact.

Common forearm extensor origin: Minimal tendinosis of the proximal
common extensor tendon.

Biceps: Intact.

Triceps: Intact.

LIGAMENTS

Medial stabilizers: Intact.

Lateral stabilizers:  Intact.

Cartilage: No chondral defect.

Joint: No joint effusion.  Normal alignment.

Cubital tunnel: Normal.

Bones: Increased T2 marrow signal in the distal humerus and proximal
radius and ulna unchanged the prior exam and likely representing red
marrow. No marrow signal abnormality. Neck fracture or dislocation.
IMPRESSION: 1. Minimal tendinosis, without a tear, of the proximal common
extensor tendon.
2. Otherwise, no injury of the right elbow.

## 2017-03-04 ENCOUNTER — Other Ambulatory Visit: Payer: Self-pay | Admitting: Obstetrics

## 2017-03-04 DIAGNOSIS — R928 Other abnormal and inconclusive findings on diagnostic imaging of breast: Secondary | ICD-10-CM

## 2017-03-20 ENCOUNTER — Other Ambulatory Visit: Payer: 59

## 2017-03-21 ENCOUNTER — Ambulatory Visit
Admission: RE | Admit: 2017-03-21 | Discharge: 2017-03-21 | Disposition: A | Discharge status: Home / Self Care | Payer: 59 | Source: Ambulatory Visit | Attending: Obstetrics | Admitting: Obstetrics

## 2017-03-21 ENCOUNTER — Other Ambulatory Visit: Payer: Self-pay | Admitting: Obstetrics

## 2017-03-21 DIAGNOSIS — R928 Other abnormal and inconclusive findings on diagnostic imaging of breast: Secondary | ICD-10-CM

## 2017-09-23 ENCOUNTER — Other Ambulatory Visit: Payer: 59

## 2017-10-07 ENCOUNTER — Other Ambulatory Visit: Payer: 59

## 2017-10-11 ENCOUNTER — Ambulatory Visit
Admission: RE | Admit: 2017-10-11 | Discharge: 2017-10-11 | Disposition: A | Discharge status: Home / Self Care | Payer: 59 | Source: Ambulatory Visit | Attending: Obstetrics | Admitting: Obstetrics

## 2017-10-11 DIAGNOSIS — R928 Other abnormal and inconclusive findings on diagnostic imaging of breast: Secondary | ICD-10-CM

## 2018-05-15 ENCOUNTER — Telehealth: Payer: Self-pay

## 2018-05-15 NOTE — Telephone Encounter (Signed)
Sent referral to scheduling and filed nnotes

## 2018-05-28 ENCOUNTER — Other Ambulatory Visit: Payer: Self-pay | Admitting: Physician Assistant

## 2018-05-28 DIAGNOSIS — R10811 Right upper quadrant abdominal tenderness: Secondary | ICD-10-CM

## 2018-05-29 ENCOUNTER — Ambulatory Visit
Admission: RE | Admit: 2018-05-29 | Discharge: 2018-05-29 | Disposition: A | Discharge status: Home / Self Care | Payer: 59 | Source: Ambulatory Visit | Attending: Physician Assistant | Admitting: Physician Assistant

## 2018-05-29 ENCOUNTER — Other Ambulatory Visit: Payer: Self-pay | Admitting: Physician Assistant

## 2018-05-29 DIAGNOSIS — M546 Pain in thoracic spine: Secondary | ICD-10-CM

## 2018-05-29 DIAGNOSIS — R10811 Right upper quadrant abdominal tenderness: Secondary | ICD-10-CM

## 2018-05-29 DIAGNOSIS — R1010 Upper abdominal pain, unspecified: Secondary | ICD-10-CM

## 2018-05-29 DIAGNOSIS — R079 Chest pain, unspecified: Secondary | ICD-10-CM

## 2018-06-03 ENCOUNTER — Encounter: Payer: Self-pay | Admitting: Internal Medicine

## 2018-06-03 ENCOUNTER — Ambulatory Visit (INDEPENDENT_AMBULATORY_CARE_PROVIDER_SITE_OTHER): Payer: 59 | Admitting: Internal Medicine

## 2018-06-03 VITALS — BP 132/90 | HR 85 | Ht 65.5 in | Wt 240.8 lb

## 2018-06-03 DIAGNOSIS — R072 Precordial pain: Secondary | ICD-10-CM

## 2018-06-03 DIAGNOSIS — Z01812 Encounter for preprocedural laboratory examination: Secondary | ICD-10-CM | POA: Diagnosis not present

## 2018-06-03 DIAGNOSIS — I10 Essential (primary) hypertension: Secondary | ICD-10-CM

## 2018-06-03 MED ORDER — METOPROLOL TARTRATE 100 MG PO TABS: ORAL_TABLET | ORAL | 0 refills | Status: DC

## 2018-06-03 NOTE — Patient Instructions (Signed)
Medication Instructions:  NO Changes If you need a refill on your cardiac medications before your next appointment, please call your pharmacy.   Lab work: BMET - complete this 1 week prior to test @ Dr. Blanchie Dessert office or any [facility] that is convenient for you If you have labs (blood work) drawn today and your tests are completely normal, you will receive your results only by: Marland Kitchen MyChart Message (if you have MyChart) OR . A paper copy in the mail If you have any lab test that is abnormal or we need to change your treatment, we will call you to review the results.  Testing/Procedures: Dr. Rennis Golden has ordered a CT coronary angiogram This is done at [facility] The test will be pre-authorized with your insurance company and then you will be called to schedule Instructions are noted below  Follow-Up: At [facility], you and your health needs are our priority.  As part of our continuing mission to provide you with exceptional heart care, we have created designated Provider Care Teams.  These Care Teams include your primary Cardiologist (physician) and Advanced Practice Providers (APPs -  Physician Assistants and Nurse Practitioners) who all work together to provide you with the care you need, when you need it. You will need a follow up appointment in 2-3 months after coronary CT.  Please call our office 2 months in advance to schedule this appointment.  You may see Dr. Rennis Golden or one of the following Advanced Practice Providers on your designated Care Team: Azalee Course, [place] . Micah Flesher, PA-C  Any Other Special Instructions Will Be Listed Below (If Applicable).   Please arrive at the [facility] main entrance of [facility] at __________ AM/PM (30-45 minutes prior to test start time)  [facility] [address] [place], [place] [address] [phone]  Proceed to the [facility] Radiology Department (First Floor).  Please follow these instructions carefully (unless  otherwise directed):   On the Night Before the Test: . Be sure to Drink plenty of water. . Do not consume any caffeinated/decaffeinated beverages or chocolate 12 hours prior to your test. . Do not take any antihistamines 12 hours prior to your test.  On the Day of the Test: . Drink plenty of water. Do not drink any water within one hour of the test. . Do not eat any food 4 hours prior to the test. . You may take your regular medications prior to the test.  . Take metoprolol (Lopressor) two hours prior to test.   After the Test: . Drink plenty of water. . After receiving IV contrast, you may experience a mild flushed feeling. This is normal. . On occasion, you may experience a mild rash up to 24 hours after the test. This is not dangerous. If this occurs, you can take Benadryl 25 mg and increase your fluid intake. . If you experience trouble breathing, this can be serious. If it is severe call 911 IMMEDIATELY. If it is mild, please call our office. . If you take any of these medications: Glipizide/Metformin, Avandament, Glucavance, please do not take 48 hours after completing test.

## 2018-06-03 NOTE — Progress Notes (Signed)
OFFICE NOTE  Chief Complaint:  Follow-up stress test  Primary Care Physician: Roger Kill, PA-C  HPI:  Stacy Harris is a pleasant 52 year old female who is currently referred for evaluation of abnormal EKG. Recently she found out her sister had uterine cancer and had some abnormalities on her uterine exam. She then went in for a hysteroscopy and partial D&C. Fortunately there was no evidence for cancer, but preoperatively she had an EKG which was abnormal demonstrating a poor R-wave progression anteriorly and possible anterior infarct. She was then referred to cardiology for evaluation. She cannot recall any prior event that sounded like an acute MI. She does have a history of hypertension although no other known coronary problems. There is no significant coronary disease in her family, more likely cancer. She denies any significant shortness of breath but does get short of breath walking up stairs, which she contributes her recent weight gain. There is some occasional sharp chest discomfort which is not necessarily associated with exertion or relieved by rest. She's not had high cholesterol or diabetes and is a nonsmoker.  08/11/2015  Stacy Harris returns today for follow-up. She underwent a exercise treadmill stress test. This was negative for ischemia although she had a hypertensive response to exercise. I suspect her symptoms may be related to weight and lack of exercise. We discussed this at length today and I feel confident that she could start a more active exercise program.  06/03/2018   Stacy Harris is seen today in follow-up.  I last saw her in 2017 at which time she was having symptoms of shortness of breath, particularly walking upstairs and occasional sharp chest discomfort.  She had EKG abnormalities of poor R wave progression and underwent exercise treadmill stress testing which was negative.  She had done pretty well until recently when she has had 2 significant episodes of  chest discomfort.  She describes it particularly as right-sided flank/upper abdominal pain that radiated around to the middle of her chest and was described as a heavy pressure sensation.  Both episodes seem to happen when she was visiting her daughter in [place].  She is originally from [facility] [place] [facility]  She saw her PCP recently was concerned about biliary disease and an ultrasound was ordered and demonstrated gallstones.  She does have a referral to surgery.  She was sent here for rule out of acute coronary disease.  EKG shows sinus rhythm with no ischemic changes at 85.  PMHx:  Past Medical History:  Diagnosis Date  . Anemia   . Arthritis    left knee  . Dysrhythmia    heart racing with hot flashes  . Epicondylitis elbow, medial   . Granular corneal dystrophy   . Headache    Migraines  . Hypertension   . Migraine   . PONV (postoperative nausea and vomiting)   . Right wrist fracture   . Seizures (HCC)    febrile sz at 9 mos  . Varicose veins     Past Surgical History:  Procedure Laterality Date  . DIAGNOSTIC LAPAROSCOPY     ovarian cyst removed  . DILATATION & CURETTAGE/HYSTEROSCOPY WITH MYOSURE N/A 06/06/2015   Procedure: DILATATION & CURETTAGE/HYSTEROSCOPY ;  Surgeon: Marlow Baars, MD;  Location: [facility];  Service: Gynecology;  Laterality: N/A;  . DILATION AND CURETTAGE OF UTERUS  1998, 2001  . LYMPH NODE DISSECTION  1985  . TENNIS ELBOW RELEASE/NIRSCHEL PROCEDURE Right 07/21/2015   Procedure:  RIGHT ELBOW DEBRIDEMENT AND TENDON REPAIR;  Surgeon: Loreta Ave, MD;  Location: [facility];  Service: Orthopedics;  Laterality: Right;  Marland Kitchen VARICOSE VEIN SURGERY  1986, 2004, 2007  . WRIST ARTHROSCOPY W/ TRIANGULAR FIBROCARTILAGE REPAIR Right 06/08/2009    FAMHx:  Family History  Problem Relation Age of Onset  . Kidney disease Father   . Hypertension Father   . Lung cancer Maternal Grandmother   . Hypertension Maternal Grandfather        ? heart  disease  . Kidney disease Paternal Grandmother   . Stroke Paternal Grandfather   . Hypertension Sister   . Uterine cancer Sister   . Breast cancer Maternal Aunt   . Breast cancer Maternal Uncle   . Breast cancer Maternal Aunt     SOCHx:   reports that she has never smoked. She has never used smokeless tobacco. She reports that she does not drink alcohol or use drugs.  ALLERGIES:  Allergies  Allergen Reactions  . Codeine Rash    ROS: A comprehensive review of systems was negative.  HOME MEDS: Current Outpatient Medications  Medication Sig Dispense Refill  . Clindamycin-Benzoyl Per, Refr, gel APPLY TO FACE IN THE MORNING  2  . doxycycline (VIBRA-TABS) 100 MG tablet Take 1 tablet by mouth 2 (two) times daily.  0  . FIBER ADULT GUMMIES PO Take by mouth daily.    . Multiple Vitamin (MULTIVITAMIN WITH MINERALS) TABS tablet Take 1 tablet by mouth daily.    . Probiotic Product (ALIGN PO) Take by mouth daily.     No current facility-administered medications for this visit.     LABS/IMAGING: No results found for this or any previous visit (from the past 48 hour(s)). No results found.  WEIGHTS: Wt Readings from Last 3 Encounters:  06/03/18 240 lb 12.8 oz (109.2 kg)  05/05/16 248 lb 6.4 oz (112.7 kg)  08/10/15 228 lb (103.4 kg)    VITALS: BP 132/90 (BP Location: Left Arm, Patient Position: Sitting, Cuff Size: Large)   Pulse 85   Ht 5' 5.5" (1.664 m)   Wt 240 lb 12.8 oz (109.2 kg)   BMI 39.46 kg/m   EXAM: General appearance: alert and no distress Neck: no carotid bruit, no JVD and thyroid not enlarged, symmetric, no tenderness/mass/nodules Lungs: clear to auscultation bilaterally Heart: regular rate and rhythm, S1, S2 normal, no murmur, click, rub or gallop Abdomen: soft, non-tender; bowel sounds normal; no masses,  no organomegaly Extremities: extremities normal, atraumatic, no cyanosis or edema Pulses: 2+ and symmetric Skin: Skin color, texture, turgor normal. No  rashes or lesions Neurologic: Grossly normal Psych: Pleasant  EKG: Sinus rhythm at 85-personally reviewed  ASSESSMENT: 1. Upper abdominal, lower chest pain 2. Abnormal EKG with poor R-wave progression and transition in lead V5 3. Hypertension-controlled 4. Atypical chest pain  PLAN: 1.   Stacy Harris is describing upper abdominal and lower chest pain with 2 episodes that radiated across her upper abdomen, and she was found to have gallstones.  I suspect this is a biliary colic however cannot rule out coronary disease.  She had an abnormal EKG in the past although stress testing was -2 years ago.  Her EKG now is completely normal.  She has few cardiac risk factors but is hypertensive and overweight.  I am recommending a coronary artery CT angio to evaluate for any obstructive coronary disease.  If this is negative without coronary calcium that would predict a low risk prognosis and would also make her acceptable risk for cholecystectomy if that  is what is required.  Plan follow-up with me afterwards.  Thanks again for referring her back  Chrystie Nose, MD, [facility]  [employer]  [facility]  Medical Director of the Advanced Lipid Disorders &  Cardiovascular Risk Reduction Clinic Diplomate of the American Board of Clinical Lipidology Attending Cardiologist  Direct Dial: [phone]  Fax: [phone]  Website:  [email or web address] Nicely Zeidy Tayag 06/03/2018, 9:38 AM

## 2018-06-05 ENCOUNTER — Observation Stay (HOSPITAL_COMMUNITY)
Admission: EM | Admit: 2018-06-05 | Discharge: 2018-06-07 | Disposition: A | Discharge status: Home / Self Care | Payer: 59 | Attending: Surgery | Admitting: Surgery

## 2018-06-05 ENCOUNTER — Other Ambulatory Visit: Payer: Self-pay

## 2018-06-05 ENCOUNTER — Ambulatory Visit (HOSPITAL_COMMUNITY): Payer: 59

## 2018-06-05 ENCOUNTER — Encounter (HOSPITAL_COMMUNITY): Payer: Self-pay | Admitting: Physician Assistant

## 2018-06-05 DIAGNOSIS — H1853 Granular corneal dystrophy: Secondary | ICD-10-CM | POA: Diagnosis not present

## 2018-06-05 DIAGNOSIS — K760 Fatty (change of) liver, not elsewhere classified: Secondary | ICD-10-CM | POA: Insufficient documentation

## 2018-06-05 DIAGNOSIS — G43909 Migraine, unspecified, not intractable, without status migrainosus: Secondary | ICD-10-CM | POA: Insufficient documentation

## 2018-06-05 DIAGNOSIS — K828 Other specified diseases of gallbladder: Secondary | ICD-10-CM | POA: Insufficient documentation

## 2018-06-05 DIAGNOSIS — K819 Cholecystitis, unspecified: Secondary | ICD-10-CM

## 2018-06-05 DIAGNOSIS — I10 Essential (primary) hypertension: Secondary | ICD-10-CM | POA: Insufficient documentation

## 2018-06-05 DIAGNOSIS — Z803 Family history of malignant neoplasm of breast: Secondary | ICD-10-CM | POA: Insufficient documentation

## 2018-06-05 DIAGNOSIS — Z8049 Family history of malignant neoplasm of other genital organs: Secondary | ICD-10-CM | POA: Insufficient documentation

## 2018-06-05 DIAGNOSIS — K5732 Diverticulitis of large intestine without perforation or abscess without bleeding: Secondary | ICD-10-CM | POA: Insufficient documentation

## 2018-06-05 DIAGNOSIS — D649 Anemia, unspecified: Secondary | ICD-10-CM | POA: Diagnosis not present

## 2018-06-05 DIAGNOSIS — M1712 Unilateral primary osteoarthritis, left knee: Secondary | ICD-10-CM | POA: Insufficient documentation

## 2018-06-05 DIAGNOSIS — Z801 Family history of malignant neoplasm of trachea, bronchus and lung: Secondary | ICD-10-CM | POA: Diagnosis not present

## 2018-06-05 DIAGNOSIS — Z79899 Other long term (current) drug therapy: Secondary | ICD-10-CM | POA: Insufficient documentation

## 2018-06-05 DIAGNOSIS — M1990 Unspecified osteoarthritis, unspecified site: Secondary | ICD-10-CM | POA: Insufficient documentation

## 2018-06-05 DIAGNOSIS — K8001 Calculus of gallbladder with acute cholecystitis with obstruction: Secondary | ICD-10-CM

## 2018-06-05 DIAGNOSIS — R079 Chest pain, unspecified: Secondary | ICD-10-CM

## 2018-06-05 DIAGNOSIS — E669 Obesity, unspecified: Secondary | ICD-10-CM | POA: Diagnosis not present

## 2018-06-05 DIAGNOSIS — K8012 Calculus of gallbladder with acute and chronic cholecystitis without obstruction: Secondary | ICD-10-CM | POA: Diagnosis not present

## 2018-06-05 DIAGNOSIS — M549 Dorsalgia, unspecified: Secondary | ICD-10-CM | POA: Diagnosis not present

## 2018-06-05 DIAGNOSIS — I8390 Asymptomatic varicose veins of unspecified lower extremity: Secondary | ICD-10-CM | POA: Diagnosis not present

## 2018-06-05 DIAGNOSIS — K8000 Calculus of gallbladder with acute cholecystitis without obstruction: Secondary | ICD-10-CM | POA: Diagnosis present

## 2018-06-05 DIAGNOSIS — Z01810 Encounter for preprocedural cardiovascular examination: Secondary | ICD-10-CM | POA: Diagnosis not present

## 2018-06-05 DIAGNOSIS — R569 Unspecified convulsions: Secondary | ICD-10-CM | POA: Diagnosis not present

## 2018-06-05 DIAGNOSIS — Z8249 Family history of ischemic heart disease and other diseases of the circulatory system: Secondary | ICD-10-CM | POA: Insufficient documentation

## 2018-06-05 DIAGNOSIS — Z885 Allergy status to narcotic agent status: Secondary | ICD-10-CM | POA: Insufficient documentation

## 2018-06-05 DIAGNOSIS — Z841 Family history of disorders of kidney and ureter: Secondary | ICD-10-CM | POA: Diagnosis not present

## 2018-06-05 DIAGNOSIS — D509 Iron deficiency anemia, unspecified: Secondary | ICD-10-CM | POA: Diagnosis not present

## 2018-06-05 DIAGNOSIS — R072 Precordial pain: Secondary | ICD-10-CM | POA: Diagnosis not present

## 2018-06-05 DIAGNOSIS — Z823 Family history of stroke: Secondary | ICD-10-CM | POA: Insufficient documentation

## 2018-06-05 DIAGNOSIS — Z6841 Body Mass Index (BMI) 40.0 and over, adult: Secondary | ICD-10-CM | POA: Insufficient documentation

## 2018-06-05 DIAGNOSIS — R1011 Right upper quadrant pain: Secondary | ICD-10-CM

## 2018-06-05 LAB — CBC WITH DIFFERENTIAL/PLATELET
Abs Immature Granulocytes: 0.04 10*3/uL (ref 0.00–0.07)
Basophils Absolute: 0 10*3/uL (ref 0.0–0.1)
Basophils Relative: 0 %
Eosinophils Absolute: 0 10*3/uL (ref 0.0–0.5)
Eosinophils Relative: 0 %
HCT: 42.8 % (ref 36.0–46.0)
Hemoglobin: 13.6 g/dL (ref 12.0–15.0)
Immature Granulocytes: 0 %
Lymphocytes Relative: 5 %
Lymphs Abs: 0.7 10*3/uL (ref 0.7–4.0)
MCH: 29 pg (ref 26.0–34.0)
MCHC: 31.8 g/dL (ref 30.0–36.0)
MCV: 91.3 fL (ref 80.0–100.0)
Monocytes Absolute: 0.7 10*3/uL (ref 0.1–1.0)
Monocytes Relative: 6 %
Neutro Abs: 11.2 10*3/uL — ABNORMAL HIGH (ref 1.7–7.7)
Neutrophils Relative %: 89 %
Platelets: 229 10*3/uL (ref 150–400)
RBC: 4.69 MIL/uL (ref 3.87–5.11)
RDW: 13.6 % (ref 11.5–15.5)
WBC: 12.7 10*3/uL — ABNORMAL HIGH (ref 4.0–10.5)
nRBC: 0 % (ref 0.0–0.2)

## 2018-06-05 LAB — COMPREHENSIVE METABOLIC PANEL
ALT: 12 U/L (ref 0–44)
AST: 14 U/L — ABNORMAL LOW (ref 15–41)
Albumin: 3.9 g/dL (ref 3.5–5.0)
Alkaline Phosphatase: 85 U/L (ref 38–126)
Anion gap: 8 (ref 5–15)
BUN: 8 mg/dL (ref 6–20)
CO2: 26 mmol/L (ref 22–32)
Calcium: 8.8 mg/dL — ABNORMAL LOW (ref 8.9–10.3)
Chloride: 101 mmol/L (ref 98–111)
Creatinine, Ser: 0.76 mg/dL (ref 0.44–1.00)
GFR calc Af Amer: >60 mL/min (ref >60)
GFR calc non Af Amer: >60 mL/min (ref >60)
Glucose, Bld: 115 mg/dL — ABNORMAL HIGH (ref 70–99)
Potassium: 3.8 mmol/L (ref 3.5–5.1)
Sodium: 135 mmol/L (ref 135–145)
Total Bilirubin: 0.7 mg/dL (ref 0.3–1.2)
Total Protein: 6.8 g/dL (ref 6.5–8.1)

## 2018-06-05 LAB — LIPASE, BLOOD: Lipase: 27 U/L (ref 11–51)

## 2018-06-05 MED ORDER — SODIUM CHLORIDE 0.9 % IV SOLN
2.0000 g | Freq: Once | INTRAVENOUS | Status: AC
  Administered 2018-06-05: 2 g via INTRAVENOUS
  Filled 2018-06-05: qty 20

## 2018-06-05 MED ORDER — DIPHENHYDRAMINE HCL 25 MG PO CAPS: 25.0000 mg | ORAL_CAPSULE | Freq: Four times a day (QID) | ORAL | Status: DC | PRN

## 2018-06-05 MED ORDER — ACETAMINOPHEN 325 MG PO TABS
650.0000 mg | ORAL_TABLET | Freq: Four times a day (QID) | ORAL | Status: DC | PRN
  Administered 2018-06-05 – 2018-06-06 (×2): 650 mg via ORAL
  Filled 2018-06-05 (×4): qty 2

## 2018-06-05 MED ORDER — ONDANSETRON 4 MG PO TBDP: 4.0000 mg | ORAL_TABLET | Freq: Four times a day (QID) | ORAL | Status: DC | PRN

## 2018-06-05 MED ORDER — METOPROLOL TARTRATE 25 MG PO TABS
50.0000 mg | ORAL_TABLET | Freq: Once | ORAL | Status: AC
  Administered 2018-06-06: 50 mg via ORAL
  Filled 2018-06-05: qty 2

## 2018-06-05 MED ORDER — SODIUM CHLORIDE 0.9 % IV BOLUS
1000.0000 mL | Freq: Once | INTRAVENOUS | Status: AC
  Administered 2018-06-05: 1000 mL via INTRAVENOUS

## 2018-06-05 MED ORDER — SODIUM CHLORIDE 0.9 % IV SOLN
2.0000 g | INTRAVENOUS | Status: DC
  Administered 2018-06-06: 2 g via INTRAVENOUS
  Filled 2018-06-05 (×2): qty 20

## 2018-06-05 MED ORDER — METOPROLOL TARTRATE 5 MG/5ML IV SOLN: 2.5000 mg | INTRAVENOUS | Status: DC | PRN

## 2018-06-05 MED ORDER — ACETAMINOPHEN 650 MG RE SUPP: 650.0000 mg | Freq: Four times a day (QID) | RECTAL | Status: DC | PRN

## 2018-06-05 MED ORDER — ONDANSETRON HCL 4 MG/2ML IJ SOLN
4.0000 mg | Freq: Four times a day (QID) | INTRAMUSCULAR | Status: DC | PRN
  Administered 2018-06-05: 4 mg via INTRAVENOUS
  Filled 2018-06-05: qty 2

## 2018-06-05 MED ORDER — HYDROMORPHONE HCL 1 MG/ML IJ SOLN
0.5000 mg | Freq: Once | INTRAMUSCULAR | Status: AC
  Administered 2018-06-05: 0.5 mg via INTRAVENOUS
  Filled 2018-06-05: qty 1

## 2018-06-05 MED ORDER — POTASSIUM CHLORIDE 2 MEQ/ML IV SOLN
INTRAVENOUS | Status: DC
  Administered 2018-06-05 – 2018-06-07 (×4): via INTRAVENOUS
  Filled 2018-06-05 (×7): qty 1000

## 2018-06-05 MED ORDER — DIPHENHYDRAMINE HCL 50 MG/ML IJ SOLN
25.0000 mg | Freq: Four times a day (QID) | INTRAMUSCULAR | Status: DC | PRN
  Administered 2018-06-05: 25 mg via INTRAVENOUS
  Filled 2018-06-05: qty 1

## 2018-06-05 MED ORDER — HYDROMORPHONE HCL 1 MG/ML IJ SOLN
0.5000 mg | INTRAMUSCULAR | Status: DC | PRN
  Administered 2018-06-05 – 2018-06-06 (×3): 1 mg via INTRAVENOUS
  Filled 2018-06-05 (×3): qty 1

## 2018-06-05 MED ORDER — ONDANSETRON HCL 4 MG/2ML IJ SOLN
4.0000 mg | Freq: Once | INTRAMUSCULAR | Status: AC
  Administered 2018-06-05: 4 mg via INTRAVENOUS
  Filled 2018-06-05: qty 2

## 2018-06-05 NOTE — Progress Notes (Signed)
Patient does not want to be filmed during the procedure.  Patient requests clarification on students training to be physicians, nurses and other allied health personnel assisting with the procedure.

## 2018-06-05 NOTE — ED Triage Notes (Signed)
Patient presents to the ED with C/O RUQ abdominal pain that radiates to her back and across her back.  States that she has a 15 mm gallstone that was identified on ultrasound last week.  She was told by her MD to come to the ED if her pain worsened.  C/O nausea but denies vomiting.

## 2018-06-05 NOTE — H&P (Signed)
Stacy Harris is an 52 y.o. female.   Chief Complaint: abdominal pain    HPI: this is a pleasant and relatively healthy 52 year old woman who presents to the emergency room with worsening right upper quadrant abdominal pain which radiates to her right flank, epigastrium and occasionally with pressure-like pain in her mid chest. She has experienced this 3 times in the last 3 weeks, the first 2 times while she was visiting family in [place], she did attempt to go the emergency room but the pain resolved before she got there. She then made an appointment to see her primary care doctor who worked her up and did discover a 3.8 cm gallstone. She had an appointment to see Dr. Ninfa Linden in our office next Monday but was advised to go to the emergency room if her pain got worse which it has. This most recent episode came on after eating and has been associated with nausea but no emesis.  She has a history of a laparoscopic treatment of a ruptured ovarian cyst in the early 90s, no other abdominal surgeries.  Of note she has a history of abnormal EKGs in the past and is followed by Dr. Debara Pickett who actually saw her 2 days ago, and had planned to order a coronary CTA prior to clearing her for surgical intervention for her gallbladder.   Afebrile, VSS, 12.7 wBC, LFT's are normal.  Ultrasound shows:  There is a 3.8 cm stone at the neck of the gallbladder wall which is not mobile. There is also gallbladder wall thickening and pericholecystic fluid. Gallbladder sludge present. No Murphy's sign.  Past Medical History:  Diagnosis Date  . Anemia   . Arthritis    left knee  . Dysrhythmia    heart racing with hot flashes  . Epicondylitis elbow, medial   . Granular corneal dystrophy   . Headache    Migraines  . Hypertension   . Migraine   . PONV (postoperative nausea and vomiting)   . Right wrist fracture   . Seizures ([place])    febrile sz at 9 mos  . Varicose veins     Past Surgical History:  Procedure  Laterality Date  . DIAGNOSTIC LAPAROSCOPY     ovarian cyst removed  . DILATATION & CURETTAGE/HYSTEROSCOPY WITH MYOSURE N/A 06/06/2015   Procedure: DILATATION & CURETTAGE/HYSTEROSCOPY ;  Surgeon: Jerelyn Charles, MD;  Location: [facility];  Service: Gynecology;  Laterality: N/A;  . [place], 2001  . LYMPH NODE DISSECTION  1985  . TENNIS ELBOW RELEASE/NIRSCHEL PROCEDURE Right 07/21/2015   Procedure:  RIGHT ELBOW DEBRIDEMENT AND TENDON REPAIR;  Surgeon: Ninetta Lights, MD;  Location: [facility];  Service: Orthopedics;  Laterality: Right;  Marland Kitchen [place], 2004, 2007  . WRIST ARTHROSCOPY W/ TRIANGULAR FIBROCARTILAGE REPAIR Right 06/08/2009    Family History  Problem Relation Age of Onset  . Kidney disease Father   . Hypertension Father   . Lung cancer Maternal Grandmother   . Hypertension Maternal Grandfather        ? heart disease  . Kidney disease Paternal Grandmother   . Stroke Paternal Grandfather   . Hypertension Sister   . Uterine cancer Sister   . Breast cancer Maternal Aunt   . Breast cancer Maternal Uncle   . Breast cancer Maternal Aunt    Social History:  reports that she has never smoked. She has never used smokeless tobacco. She reports that she does not drink  alcohol or use drugs.  Allergies:  Allergies  Allergen Reactions  . Codeine Rash    Prior to Admission medications   Medication Sig Start Date End Date Taking? Authorizing Provider  Clindamycin-Benzoyl Per, Refr, gel Apply 1 application topically every morning.  05/28/18  Yes [provider]  doxycycline (VIBRA-TABS) 100 MG tablet Take 1 tablet by mouth 2 (two) times daily. 05/28/18  Yes [provider]  FIBER ADULT GUMMIES PO Take 1 tablet by mouth daily.    Yes [provider]  ibuprofen (ADVIL,MOTRIN) 200 MG tablet Take 800 mg by mouth every 6 (six) hours as needed for moderate pain.   Yes [provider]  Multiple  Vitamin (MULTIVITAMIN WITH MINERALS) TABS tablet Take 1 tablet by mouth every morning.    Yes [provider]  Probiotic Product (ALIGN PO) Take 1 tablet by mouth every morning.    Yes [provider]  metoprolol tartrate (LOPRESSOR) 100 MG tablet Take ONE tablet TWO HOURS prior to test. Patient not taking: Reported on 06/05/2018 06/03/18   Pixie Casino, MD     Results for orders placed or performed during the hospital encounter of 06/05/18 (from the past 48 hour(s))  CBC with Differential     Status: Abnormal   Collection Time: 06/05/18  2:34 PM  Result Value Ref Range   WBC 12.7 (H) 4.0 - 10.5 K/uL   RBC 4.69 3.87 - 5.11 MIL/uL   Hemoglobin 13.6 12.0 - 15.0 g/dL   HCT 42.8 36.0 - 46.0 %   MCV 91.3 80.0 - 100.0 fL   MCH 29.0 26.0 - 34.0 pg   MCHC 31.8 30.0 - 36.0 g/dL   RDW 13.6 11.5 - 15.5 %   Platelets 229 150 - 400 K/uL   nRBC 0.0 0.0 - 0.2 %   Neutrophils Relative % 89 %   Neutro Abs 11.2 (H) 1.7 - 7.7 K/uL   Lymphocytes Relative 5 %   Lymphs Abs 0.7 0.7 - 4.0 K/uL   Monocytes Relative 6 %   Monocytes Absolute 0.7 0.1 - 1.0 K/uL   Eosinophils Relative 0 %   Eosinophils Absolute 0.0 0.0 - 0.5 K/uL   Basophils Relative 0 %   Basophils Absolute 0.0 0.0 - 0.1 K/uL   Immature Granulocytes 0 %   Abs Immature Granulocytes 0.04 0.00 - 0.07 K/uL    Comment: Performed at [facility], [address] [place], [place] [address]  Comprehensive metabolic panel     Status: Abnormal   Collection Time: 06/05/18  2:34 PM  Result Value Ref Range   Sodium 135 135 - 145 mmol/L   Potassium 3.8 3.5 - 5.1 mmol/L   Chloride 101 98 - 111 mmol/L   CO2 26 22 - 32 mmol/L   Glucose, Bld 115 (H) 70 - 99 mg/dL   BUN 8 6 - 20 mg/dL   Creatinine, Ser 0.76 0.44 - 1.00 mg/dL   Calcium 8.8 (L) 8.9 - 10.3 mg/dL   Total Protein 6.8 6.5 - 8.1 g/dL   Albumin 3.9 3.5 - 5.0 g/dL   AST 14 (L) 15 - 41 U/L   ALT 12 0 - 44 U/L   Alkaline Phosphatase 85 38 - 126 U/L   Total  Bilirubin 0.7 0.3 - 1.2 mg/dL   GFR calc non Af Amer >60 >60 mL/min   GFR calc Af Amer >60 >60 mL/min    Comment: (NOTE) The eGFR has been calculated using the CKD EPI equation. This  calculation has not been validated in all clinical situations. eGFR's persistently <60 mL/min signify possible Chronic Kidney Disease.    Anion gap 8 5 - 15    Comment: Performed at [facility] [address] [place], [place] [address]  Lipase, blood     Status: None   Collection Time: 06/05/18  2:34 PM  Result Value Ref Range   Lipase 27 11 - 51 U/L    Comment: Performed at [facility], [place] [address], [place], [place] [address]   US Abdomen Limited Ruq  Result Date: 06/05/2018 CLINICAL DATA:  Right upper quadrant pain. EXAM: ULTRASOUND ABDOMEN LIMITED RIGHT UPPER QUADRANT COMPARISON:  May 29, 2018 FINDINGS: Gallbladder: There is a 3.8 cm stone in the gallbladder neck which appears to be non mobile. Wall thickening to 7.5 mm and pericholecystic fluid identified. Gallbladder sludge noted. No Murphy's sign. Common bile duct: Diameter: 5.6 mm Liver: A 3.5 x 3 x 2.6 cm hypoechoic mass adjacent to the gallbladder fossa is again identified. Diffuse increased echogenicity in the liver elsewhere. Portal vein is patent on color Doppler imaging with normal direction of blood flow towards the liver. IMPRESSION: 1. There is a 3.8 cm stone at the neck of the gallbladder wall which is not mobile. There is also gallbladder wall thickening and pericholecystic fluid. Gallbladder sludge present. No Murphy's sign. A HIDA scan could further evaluate as clinically warranted. Given the lack of Murphy's sign, the findings are not definitive but acute cholecystitis is not excluded. 2. The common bile duct is normal in caliber. 3. The mass adjacent the gallbladder fossa is nonspecific but could represent focal fatty sparing. An MRI could better assess. Electronically Signed   By: Dorise Bullion III M.D   On: 06/05/2018  15:50    Review of Systems  All other systems reviewed and are negative.   Blood pressure (!) 153/88, pulse 92, temperature 100 F (37.8 C), temperature source Oral, resp. rate 14, height '5\' 6"'  (1.676 m), weight 107.5 kg, SpO2 99 %. Physical Exam  Constitutional: She is oriented to person, place, and time. She appears well-developed and well-nourished. No distress.  HENT:  Head: Normocephalic and atraumatic.  Mouth/Throat: Oropharynx is clear and moist.  Eyes: Conjunctivae and EOM are normal.  Neck: Normal range of motion. Neck supple.  Cardiovascular: Normal rate, regular rhythm and intact distal pulses.  She does have mild bilateral nonpitting lower extremity edema  Respiratory: Effort normal and breath sounds normal. She exhibits no tenderness.  GI: Soft. She exhibits no distension. There is tenderness.  She is tender in the right subcostal margin  Musculoskeletal: Normal range of motion. She exhibits no tenderness or deformity.  Neurological: She is alert and oriented to person, place, and time.  Skin: Skin is warm and dry.  Psychiatric: She has a normal mood and affect. Judgment and thought content normal.     Assessment/Plan Acute calculous cholecystitis. I recommend proceeding with laparoscopic cholecystectomy with possible cholangiogram. Discussed risks of surgery including bleeding, pain, scarring, intraabdominal injury specifically to the common bile duct and sequelae, conversion to open surgery, blood clot, pneumonia, heart attack, stroke, failure to resolve symptoms, etc. Questions welcomed and answered. Cardiology has been consulted for preoperative clearance given her history. In the interim, we will admit to the surgical floor, fluid resuscitation, pain and nausea control, empiric antibiotics.   Clovis Riley MD  06/05/2018 4:59 PM

## 2018-06-05 NOTE — Consult Note (Addendum)
Cardiology Consultation:   Patient ID: JAYDY FITZHENRY; [id number]; 12-Jan-1966   Admit date: 06/05/2018 Date of Consult: 06/05/2018  Primary Care Provider: Roger Kill, PA-C Primary Cardiologist: Chrystie Nose, MD 06/03/2018 Primary Electrophysiologist:  None   Patient Profile:   Stacy Harris is a 52 y.o. female with a hx of abnormal ECG, HTN, OA, anemia, who is being seen today for preop evaluation for lab choley at the request of Dr Effie Shy.   History of Present Illness:   Stacy Harris was seen by Dr Rennis Golden 06/03/2018 and evaluated for (atypical) chest pain, in the setting of newly dx gallstone, likely to need surgery. He recommended cardiac CT. It is ordered, has not been performed.   Today, her pain worsened and she came to the ER as instructed. She was seen by surgery, likely needs lap choley, cards asked to see.   Her symptoms have not changed a great deal since she saw Dr. Rennis Golden.    Her life has been in up people lately because of separating from her husband.  Therefore, for the last 3 months she has not been doing very much.  Prior to that, she was trying to walk for exercise.  She has some issues with dyspnea on exertion, no recent change.  The main activity limiting factor in the last week or 2, has been her GI issues.  The started when she was visiting her daughter in [place].  She likely had one episode of biliary colic then that resolved fairly quickly, with the episodes occurring more frequently going forward.  Currently, she is uncomfortable at rest and is looking forward to getting the surgery performed tomorrow or Saturday.   Past Medical History:  Diagnosis Date  . Anemia   . Arthritis    left knee  . Dysrhythmia    heart racing with hot flashes  . Epicondylitis elbow, medial   . Granular corneal dystrophy   . Headache    Migraines  . Hypertension   . Migraine   . PONV (postoperative nausea and vomiting)   . Right wrist fracture   . Seizures ([facility])     febrile sz at 9 mos  . Varicose veins     Past Surgical History:  Procedure Laterality Date  . DIAGNOSTIC LAPAROSCOPY     ovarian cyst removed  . DILATATION & CURETTAGE/HYSTEROSCOPY WITH MYOSURE N/A 06/06/2015   Procedure: DILATATION & CURETTAGE/HYSTEROSCOPY ;  Surgeon: Marlow Baars, MD;  Location: [facility];  Service: Gynecology;  Laterality: N/A;  . DILATION AND CURETTAGE OF UTERUS  1998, 2001  . LYMPH NODE DISSECTION  1985  . TENNIS ELBOW RELEASE/NIRSCHEL PROCEDURE Right 07/21/2015   Procedure:  RIGHT ELBOW DEBRIDEMENT AND TENDON REPAIR;  Surgeon: Loreta Ave, MD;  Location: [facility];  Service: Orthopedics;  Laterality: Right;  Marland Kitchen VARICOSE VEIN SURGERY  1986, 2004, 2007  . WRIST ARTHROSCOPY W/ TRIANGULAR FIBROCARTILAGE REPAIR Right 06/08/2009     Prior to Admission medications   Medication Sig Start Date End Date Taking? Authorizing Provider  Clindamycin-Benzoyl Per, Refr, gel Apply 1 application topically every morning.  05/28/18  Yes [provider]  doxycycline (VIBRA-TABS) 100 MG tablet Take 1 tablet by mouth 2 (two) times daily. 05/28/18  Yes [provider]  FIBER ADULT GUMMIES PO Take 1 tablet by mouth daily.    Yes [provider]  ibuprofen (ADVIL,MOTRIN) 200 MG tablet Take 800 mg by mouth every 6 (six) hours as needed for moderate pain.  Yes [provider]  Multiple Vitamin (MULTIVITAMIN WITH MINERALS) TABS tablet Take 1 tablet by mouth every morning.    Yes [provider]  Probiotic Product (ALIGN PO) Take 1 tablet by mouth every morning.    Yes [provider]  metoprolol tartrate (LOPRESSOR) 100 MG tablet Take ONE tablet TWO HOURS prior to test. Patient not taking: Reported on 06/05/2018 06/03/18   Chrystie Nose, MD    Inpatient Medications: Scheduled Meds:  Continuous Infusions: . cefTRIAXone (ROCEPHIN)  IV    . lactated ringers with kcl    . sodium chloride 1,000 mL (06/05/18 1629)    PRN Meds:   Allergies:    Allergies  Allergen Reactions  . Codeine Rash    Social History:   Social History   Socioeconomic History  . Marital status: Married    Spouse name: Not on file  . Number of children: 2  . Years of education: B.A.  . Highest education level: Not on file  Occupational History  . Occupation: Housewife  Social Needs  . Financial resource strain: Not on file  . Food insecurity:    Worry: Not on file    Inability: Not on file  . Transportation needs:    Medical: Not on file    Non-medical: Not on file  Tobacco Use  . Smoking status: Never Smoker  . Smokeless tobacco: Never Used  Substance and Sexual Activity  . Alcohol use: No  . Drug use: No  . Sexual activity: Yes    Birth control/protection: Condom  Lifestyle  . Physical activity:    Days per week: Not on file    Minutes per session: Not on file  . Stress: Not on file  Relationships  . Social connections:    Talks on phone: Not on file    Gets together: Not on file    Attends religious service: Not on file    Active member of club or organization: Not on file    Attends meetings of clubs or organizations: Not on file    Relationship status: Not on file  . Intimate partner violence:    Fear of current or ex partner: Not on file    Emotionally abused: Not on file    Physically abused: Not on file    Forced sexual activity: Not on file  Other Topics Concern  . Not on file  Social History Narrative   epworth sleepiness scale = 5 (2015-06-20)    Family History:   Family History  Problem Relation Age of Onset  . Kidney disease Father   . Hypertension Father   . Lung cancer Maternal Grandmother   . Hypertension Maternal Grandfather        ? heart disease  . Kidney disease Paternal Grandmother   . Stroke Paternal Grandfather   . Hypertension Sister   . Uterine cancer Sister   . Breast cancer Maternal Aunt   . Breast cancer Maternal Uncle   . Breast cancer Maternal Aunt     Family Status:  Family Status  Relation Name Status  . Mother  Alive       age 68 as of 06-20-15  . Father  Alive       age 13 as of June 20, 2015  . MGM  Deceased at age 65  . MGF  Deceased at age 63  . PGM  Deceased at age 15  . PGF  Deceased  . Brother  Alive  . Brother  Alive  .  Sister  Alive       hypertension, uterine cancer  . Sister  (Not Specified)  . Sister  (Not Specified)  . Mat Aunt  (Not Specified)  . Mat Uncle  (Not Specified)  . Mat Aunt  (Not Specified)    ROS:  Please see the history of present illness.  All other ROS reviewed and negative.     Physical Exam/Data:   Vitals:   06/05/18 1426 06/05/18 1439 06/05/18 1444  BP: (!) 152/95 (!) 153/88   Pulse: 94 92   Resp: 17 14   Temp: 98.5 F (36.9 C) 100 F (37.8 C)   TempSrc: Oral Oral   SpO2: 99% 99%   Weight:   107.5 kg  Height:   5\' 6"  (1.676 m)   No intake or output data in the 24 hours ending 06/05/18 1705 Filed Weights   06/05/18 1444  Weight: 107.5 kg   Body mass index is 38.25 kg/m.  General:  Well nourished, well developed, in no acute distress at rest HEENT: normal Lymph: no adenopathy Neck: no JVD Endocrine:  No thryomegaly Vascular: No carotid bruits; 4/4 extremity pulses 2+, without bruits  Cardiac:  normal S1, S2; RRR; no murmur  Lungs:  clear to auscultation bilaterally, no wheezing, rhonchi or rales  Abd: soft, very tender, no hepatomegaly, bowel sounds present Ext: no edema Musculoskeletal:  No deformities, BUE and BLE strength normal and equal Skin: warm and dry  Neuro:  CNs 2-12 intact, no focal abnormalities noted Psych:  Normal affect   EKG:  The EKG was personally reviewed and demonstrates: 11/5 ECG is sinus rhythm, heart rate 85, no acute ischemic changes Telemetry:  Telemetry was personally reviewed and demonstrates: Sinus rhythm  Relevant CV Studies:  ETT: 07/15/2015 ECG Baseline ECG exhibits normal sinus rhythm..  Stress Findings The patient exercised  following the Bruce protocol.  The patient reported shortness of breath during the stress test. The patient experienced no angina during the stress test.   The test was stopped because the patient complained of fatigue and shortness of breath.   Heart rate demonstrated a normal response to exercise. Blood pressure demonstrated a blunted response to exercise. Overall, the patient's exercise capacity was normal.   85% of maximum heart rate was achieved after 5.2 minutes. Recovery time: 10 minutes.  Duke Treadmill Score: intermediate risk The patient's response to exercise was adequate for diagnosis.  Response to Stress Arrhythmias during stress: none.  Arrhythmias during recovery: none.  There were no significant arrhythmias noted during the test.  ECG was interpretable and there was no significant change from baseline.    Laboratory Data:  Chemistry Recent Labs  Lab 06/05/18 1434  NA 135  K 3.8  CL 101  CO2 26  GLUCOSE 115*  BUN 8  CREATININE 0.76  CALCIUM 8.8*  GFRNONAA >60  GFRAA >60  ANIONGAP 8    Lab Results  Component Value Date   ALT 12 06/05/2018   AST 14 (L) 06/05/2018   ALKPHOS 85 06/05/2018   BILITOT 0.7 06/05/2018   Hematology Recent Labs  Lab 06/05/18 1434  WBC 12.7*  RBC 4.69  HGB 13.6  HCT 42.8  MCV 91.3  MCH 29.0  MCHC 31.8  RDW 13.6  PLT 229    Radiology/Studies:  US Abdomen Limited Ruq  Result Date: 06/05/2018 CLINICAL DATA:  Right upper quadrant pain. EXAM: ULTRASOUND ABDOMEN LIMITED RIGHT UPPER QUADRANT COMPARISON:  May 29, 2018 FINDINGS: Gallbladder: There is a 3.8 cm stone  in the gallbladder neck which appears to be non mobile. Wall thickening to 7.5 mm and pericholecystic fluid identified. Gallbladder sludge noted. No Murphy's sign. Common bile duct: Diameter: 5.6 mm Liver: A 3.5 x 3 x 2.6 cm hypoechoic mass adjacent to the gallbladder fossa is again identified. Diffuse increased echogenicity in the liver elsewhere. Portal vein is  patent on color Doppler imaging with normal direction of blood flow towards the liver. IMPRESSION: 1. There is a 3.8 cm stone at the neck of the gallbladder wall which is not mobile. There is also gallbladder wall thickening and pericholecystic fluid. Gallbladder sludge present. No Murphy's sign. A HIDA scan could further evaluate as clinically warranted. Given the lack of Murphy's sign, the findings are not definitive but acute cholecystitis is not excluded. 2. The common bile duct is normal in caliber. 3. The mass adjacent the gallbladder fossa is nonspecific but could represent focal fatty sparing. An MRI could better assess. Electronically Signed   By: Gerome Sam III M.D   On: 06/05/2018 15:50    Assessment and Plan:   1.  Preoperative cardiovascular examination: -Seen recently by Dr. Rennis Golden and cardiac CT planned, but not yet performed. -Contacted the CT department, they will try to obtain the CT first thing in the morning. - Make sure she has an 18-gauge antecubital IV in place and give premed at 4 AM Have contacted the reading physicians for tomorrow to make sure it can be read.  Active Problems: 2.  Cholelithiasis with acute cholecystitis -Per IM/surgery -Per the patient, they want to do surgery tomorrow or Saturday. - We will try to complete the cardiac evaluation tomorrow morning    For questions or updates, please contact [facility] Please consult [email or web address] for contact info under Cardiology/STEMI.   Signed, Theodore Demark, PA-C  06/05/2018 5:05 PM   I have examined the patient and reviewed assessment and plan and discussed with patient.  Agree with above as stated.  Cholecystitis and pain in back, stomach and at times the chest.  CT angio of coronaries was already ordered and insurance approval was in progress.  Will plan on doing the CTA coronaries in the hospital.  Based on recent ECG, I do not think she is actively having ischemia.  Will recheck an ECG.  If  surgery becomes emergent and needs to be done before the coronary CT can be done, I think she will do fine with surgery and can proceed.  SHe is not in heart failure.     Plan for CT in AM with results ASAP.     Lance Muss

## 2018-06-05 NOTE — Progress Notes (Signed)
Patient awaiting clarification before signing form.

## 2018-06-05 NOTE — ED Provider Notes (Signed)
[facility] EMERGENCY DEPARTMENT Provider Note   CSN: [id number] Arrival date & time: 06/05/18  1409     History   Chief Complaint Chief Complaint  Patient presents with  . Abdominal Pain    HPI Stacy Harris is a 52 y.o. female.  HPI   52 year old female presents today with complaints of right upper quadrant abdominal pain.  Patient notes symptoms originally started approximately 3 weeks ago with episodes of sharp pain.  She notes a longest episode lasted approximately 36 hours and resolved completely.  Patient notes again last night she developed a sharp stabbing pain in the right upper quadrant typical of previous episodes.  She notes associated nausea, denies any vomiting.  She notes the pain has been persistent since that time.  She notes continued nausea, was able to tolerate English muffin and ginger ale around 1130 (3 hours prior to arrival), this did not significantly worsened symptoms, this did not improve her symptoms.  She denies any fever, lower abdominal pain, denies any urinary or bowel changes.  No history of abdominal surgeries.  Patient has a scheduled appointment for surgery with general surgery next Monday.  Past Medical History:  Diagnosis Date  . Anemia   . Arthritis    left knee  . Dysrhythmia    heart racing with hot flashes  . Epicondylitis elbow, medial   . Granular corneal dystrophy   . Headache    Migraines  . Hypertension   . Migraine   . PONV (postoperative nausea and vomiting)   . Right wrist fracture   . Seizures (HCC)    febrile sz at 9 mos  . Varicose veins     Patient Active Problem List   Diagnosis Date Noted  . Cholelithiasis with acute cholecystitis 06/05/2018  . Precordial chest pain 06/03/2018  . Pre-procedure lab exam 06/03/2018  . Sepsis (HCC) 05/05/2016  . Iron deficiency anemia 05/05/2016  . Diverticulitis of large intestine with perforation without abscess or bleeding   . Abnormal EKG 06/16/2015  .  Essential hypertension 06/16/2015    Past Surgical History:  Procedure Laterality Date  . DIAGNOSTIC LAPAROSCOPY     ovarian cyst removed  . DILATATION & CURETTAGE/HYSTEROSCOPY WITH MYOSURE N/A 06/06/2015   Procedure: DILATATION & CURETTAGE/HYSTEROSCOPY ;  Surgeon: Marlow Baars, MD;  Location: [facility];  Service: Gynecology;  Laterality: N/A;  . DILATION AND CURETTAGE OF UTERUS  1998, 2001  . LYMPH NODE DISSECTION  1985  . TENNIS ELBOW RELEASE/NIRSCHEL PROCEDURE Right 07/21/2015   Procedure:  RIGHT ELBOW DEBRIDEMENT AND TENDON REPAIR;  Surgeon: Loreta Ave, MD;  Location: [facility];  Service: Orthopedics;  Laterality: Right;  Marland Kitchen VARICOSE VEIN SURGERY  1986, 2004, 2007  . WRIST ARTHROSCOPY W/ TRIANGULAR FIBROCARTILAGE REPAIR Right 06/08/2009     OB History   None      Home Medications    Prior to Admission medications   Medication Sig Start Date End Date Taking? Authorizing Provider  Clindamycin-Benzoyl Per, Refr, gel Apply 1 application topically every morning.  05/28/18  Yes [provider]  doxycycline (VIBRA-TABS) 100 MG tablet Take 1 tablet by mouth 2 (two) times daily. 05/28/18  Yes [provider]  FIBER ADULT GUMMIES PO Take 1 tablet by mouth daily.    Yes [provider]  ibuprofen (ADVIL,MOTRIN) 200 MG tablet Take 800 mg by mouth every 6 (six) hours as needed for moderate pain.   Yes [provider]  Multiple Vitamin (MULTIVITAMIN WITH  MINERALS) TABS tablet Take 1 tablet by mouth every morning.    Yes [provider]  Probiotic Product (ALIGN PO) Take 1 tablet by mouth every morning.    Yes [provider]  metoprolol tartrate (LOPRESSOR) 100 MG tablet Take ONE tablet TWO HOURS prior to test. Patient not taking: Reported on 06/05/2018 06/03/18   Chrystie Nose, MD    Family History Family History  Problem Relation Age of Onset  . Kidney disease Father   . Hypertension Father   . Lung cancer  Maternal Grandmother   . Hypertension Maternal Grandfather        ? heart disease  . Kidney disease Paternal Grandmother   . Stroke Paternal Grandfather   . Hypertension Sister   . Uterine cancer Sister   . Breast cancer Maternal Aunt   . Breast cancer Maternal Uncle   . Breast cancer Maternal Aunt     Social History Social History   Tobacco Use  . Smoking status: Never Smoker  . Smokeless tobacco: Never Used  Substance Use Topics  . Alcohol use: No  . Drug use: No     Allergies   Codeine   Review of Systems Review of Systems  All other systems reviewed and are negative.    Physical Exam Updated Vital Signs BP (!) 153/88 (BP Location: Right Arm)   Pulse 92   Temp 100 F (37.8 C) (Oral)   Resp 14   Ht 5\' 6"  (1.676 m)   Wt 107.5 kg   SpO2 99%   BMI 38.25 kg/m   Physical Exam  Constitutional: She is oriented to person, place, and time. She appears well-developed and well-nourished.  HENT:  Head: Normocephalic and atraumatic.  Eyes: Pupils are equal, round, and reactive to light. Conjunctivae are normal. Right eye exhibits no discharge. Left eye exhibits no discharge. No scleral icterus.  Neck: Normal range of motion. No JVD present. No tracheal deviation present.  Pulmonary/Chest: Effort normal. No stridor.  Abdominal:  Exquisite tenderness palpation of right upper quadrant, remainder abdomen soft nontender  Neurological: She is alert and oriented to person, place, and time. Coordination normal.  Psychiatric: She has a normal mood and affect. Her behavior is normal. Judgment and thought content normal.  Nursing note and vitals reviewed.    ED Treatments / Results  Labs (all labs ordered are listed, but only abnormal results are displayed) Labs Reviewed  CBC WITH DIFFERENTIAL/PLATELET - Abnormal; Notable for the following components:      Result Value   WBC 12.7 (*)    Neutro Abs 11.2 (*)    All other components within normal limits  COMPREHENSIVE  METABOLIC PANEL - Abnormal; Notable for the following components:   Glucose, Bld 115 (*)    Calcium 8.8 (*)    AST 14 (*)    All other components within normal limits  LIPASE, BLOOD  HIV ANTIBODY (ROUTINE TESTING W REFLEX)    EKG None  Radiology US Abdomen Limited Ruq  Result Date: 06/05/2018 CLINICAL DATA:  Right upper quadrant pain. EXAM: ULTRASOUND ABDOMEN LIMITED RIGHT UPPER QUADRANT COMPARISON:  May 29, 2018 FINDINGS: Gallbladder: There is a 3.8 cm stone in the gallbladder neck which appears to be non mobile. Wall thickening to 7.5 mm and pericholecystic fluid identified. Gallbladder sludge noted. No Murphy's sign. Common bile duct: Diameter: 5.6 mm Liver: A 3.5 x 3 x 2.6 cm hypoechoic mass adjacent to the gallbladder fossa is again identified. Diffuse increased echogenicity in the liver elsewhere.  Portal vein is patent on color Doppler imaging with normal direction of blood flow towards the liver. IMPRESSION: 1. There is a 3.8 cm stone at the neck of the gallbladder wall which is not mobile. There is also gallbladder wall thickening and pericholecystic fluid. Gallbladder sludge present. No Murphy's sign. A HIDA scan could further evaluate as clinically warranted. Given the lack of Murphy's sign, the findings are not definitive but acute cholecystitis is not excluded. 2. The common bile duct is normal in caliber. 3. The mass adjacent the gallbladder fossa is nonspecific but could represent focal fatty sparing. An MRI could better assess. Electronically Signed   By: Gerome Sam III M.D   On: 06/05/2018 15:50    Procedures Procedures (including critical care time)  Medications Ordered in ED Medications  cefTRIAXone (ROCEPHIN) 2 g in sodium chloride 0.9 % 100 mL IVPB (2 g Intravenous New Bag/Given 06/05/18 1631)  sodium chloride 0.9 % bolus 1,000 mL (1,000 mLs Intravenous New Bag/Given 06/05/18 1629)  lactated ringers 1,000 mL with potassium chloride 20 mEq infusion (has no  administration in time range)  cefTRIAXone (ROCEPHIN) 2 g in sodium chloride 0.9 % 100 mL IVPB (has no administration in time range)  HYDROmorphone (DILAUDID) injection 0.5-1 mg (has no administration in time range)  acetaminophen (TYLENOL) tablet 650 mg (has no administration in time range)    Or  acetaminophen (TYLENOL) suppository 650 mg (has no administration in time range)  diphenhydrAMINE (BENADRYL) capsule 25 mg (has no administration in time range)    Or  diphenhydrAMINE (BENADRYL) injection 25 mg (has no administration in time range)  ondansetron (ZOFRAN-ODT) disintegrating tablet 4 mg (has no administration in time range)    Or  ondansetron (ZOFRAN) injection 4 mg (has no administration in time range)  HYDROmorphone (DILAUDID) injection 0.5 mg (0.5 mg Intravenous Given 06/05/18 1509)  ondansetron (ZOFRAN) injection 4 mg (4 mg Intravenous Given 06/05/18 1508)     Initial Impression / Assessment and Plan / ED Course  I have reviewed the triage vital signs and the nursing notes.  Pertinent labs & imaging results that were available during my care of the patient were reviewed by me and considered in my medical decision making (see chart for details).     Labs: CBC, CMP, lipase  Imaging: Right upper quadrant ultrasound  Consults: General surgery  Therapeutics: Ceftriaxone, Dilaudid  Discharge Meds:   Assessment/Plan: 51 year old female presents today with likely acute cholecystitis.  She has a temperature of 100, white count of, 0.7 and nonmobile 3.5 cm stone in the neck of the gallbladder.  She has no signs of obstruction but has persistent pain.  General surgery consulted who evaluated patient at bedside.      Final Clinical Impressions(s) / ED Diagnoses   Final diagnoses:  RUQ abdominal pain  Cholecystitis    ED Discharge Orders    None       Eyvonne Mechanic, PA-C 06/05/18 1700    Mancel Bale, MD 06/06/18 1358

## 2018-06-05 NOTE — ED Notes (Signed)
Got patient on the monitor unfdress into a gown patient is resting with call bell in reach

## 2018-06-06 ENCOUNTER — Observation Stay (HOSPITAL_COMMUNITY): Payer: 59 | Admitting: Anesthesiology

## 2018-06-06 ENCOUNTER — Encounter (HOSPITAL_COMMUNITY): Payer: Self-pay

## 2018-06-06 ENCOUNTER — Encounter (HOSPITAL_COMMUNITY)
Admission: EM | Disposition: A | Discharge status: Home / Self Care | Payer: Self-pay | Source: Home / Self Care | Attending: Emergency Medicine

## 2018-06-06 ENCOUNTER — Observation Stay (HOSPITAL_COMMUNITY): Payer: 59

## 2018-06-06 DIAGNOSIS — K8001 Calculus of gallbladder with acute cholecystitis with obstruction: Secondary | ICD-10-CM | POA: Diagnosis not present

## 2018-06-06 DIAGNOSIS — R079 Chest pain, unspecified: Secondary | ICD-10-CM | POA: Diagnosis not present

## 2018-06-06 DIAGNOSIS — R072 Precordial pain: Secondary | ICD-10-CM | POA: Diagnosis not present

## 2018-06-06 HISTORY — PX: CHOLECYSTECTOMY: SHX55

## 2018-06-06 LAB — CBC
HCT: 40.9 % (ref 36.0–46.0)
HEMOGLOBIN: 13 g/dL (ref 12.0–15.0)
MCH: 28.8 pg (ref 26.0–34.0)
MCHC: 31.8 g/dL (ref 30.0–36.0)
MCV: 90.5 fL (ref 80.0–100.0)
Platelets: 255 10*3/uL (ref 150–400)
RBC: 4.52 MIL/uL (ref 3.87–5.11)
RDW: 14.2 % (ref 11.5–15.5)
WBC: 13.3 10*3/uL — AB (ref 4.0–10.5)
nRBC: 0 % (ref 0.0–0.2)

## 2018-06-06 LAB — COMPREHENSIVE METABOLIC PANEL
ALBUMIN: 3.5 g/dL (ref 3.5–5.0)
ALT: 27 U/L (ref 0–44)
AST: 27 U/L (ref 15–41)
Alkaline Phosphatase: 81 U/L (ref 38–126)
Anion gap: 8 (ref 5–15)
BUN: 7 mg/dL (ref 6–20)
CO2: 24 mmol/L (ref 22–32)
Calcium: 8.4 mg/dL — ABNORMAL LOW (ref 8.9–10.3)
Chloride: 103 mmol/L (ref 98–111)
Creatinine, Ser: 0.85 mg/dL (ref 0.44–1.00)
GFR calc Af Amer: >60 mL/min (ref >60)
GLUCOSE: 123 mg/dL — AB (ref 70–99)
POTASSIUM: 4.1 mmol/L (ref 3.5–5.1)
Sodium: 135 mmol/L (ref 135–145)
TOTAL PROTEIN: 6.4 g/dL — AB (ref 6.5–8.1)
Total Bilirubin: 1.2 mg/dL (ref 0.3–1.2)

## 2018-06-06 LAB — PROTIME-INR
INR: 1.11
PROTHROMBIN TIME: 14.2 s (ref 11.4–15.2)

## 2018-06-06 LAB — LIPASE, BLOOD: Lipase: 29 U/L (ref 11–51)

## 2018-06-06 LAB — HIV ANTIBODY (ROUTINE TESTING W REFLEX): HIV Screen 4th Generation wRfx: NONREACTIVE

## 2018-06-06 SURGERY — LAPAROSCOPIC CHOLECYSTECTOMY WITH INTRAOPERATIVE CHOLANGIOGRAM
Anesthesia: General | Site: Abdomen

## 2018-06-06 MED ORDER — MIDAZOLAM HCL 2 MG/2ML IJ SOLN
INTRAMUSCULAR | Status: AC
  Filled 2018-06-06: qty 2

## 2018-06-06 MED ORDER — OXYCODONE HCL 5 MG PO TABS
5.0000 mg | ORAL_TABLET | ORAL | Status: DC | PRN
  Administered 2018-06-06 – 2018-06-07 (×2): 10 mg via ORAL
  Filled 2018-06-06 (×2): qty 2

## 2018-06-06 MED ORDER — HYDROMORPHONE HCL 1 MG/ML IJ SOLN: 0.5000 mg | INTRAMUSCULAR | Status: DC | PRN

## 2018-06-06 MED ORDER — NITROGLYCERIN 0.4 MG SL SUBL
SUBLINGUAL_TABLET | SUBLINGUAL | Status: AC
  Filled 2018-06-06: qty 2

## 2018-06-06 MED ORDER — 0.9 % SODIUM CHLORIDE (POUR BTL) OPTIME
TOPICAL | Status: DC | PRN
  Administered 2018-06-06: 1000 mL

## 2018-06-06 MED ORDER — ONDANSETRON HCL 4 MG/2ML IJ SOLN
INTRAMUSCULAR | Status: AC
  Filled 2018-06-06: qty 2

## 2018-06-06 MED ORDER — LACTATED RINGERS IV SOLN
INTRAVENOUS | Status: DC
  Administered 2018-06-06 (×2): via INTRAVENOUS

## 2018-06-06 MED ORDER — KETOROLAC TROMETHAMINE 30 MG/ML IJ SOLN
INTRAMUSCULAR | Status: AC
  Filled 2018-06-06: qty 1

## 2018-06-06 MED ORDER — SODIUM CHLORIDE 0.9 % IR SOLN
Status: DC | PRN
  Administered 2018-06-06: 1000 mL

## 2018-06-06 MED ORDER — PROPOFOL 10 MG/ML IV BOLUS
INTRAVENOUS | Status: AC
  Filled 2018-06-06: qty 20

## 2018-06-06 MED ORDER — PROMETHAZINE HCL 25 MG/ML IJ SOLN: 6.2500 mg | INTRAMUSCULAR | Status: DC | PRN

## 2018-06-06 MED ORDER — ROCURONIUM BROMIDE 10 MG/ML (PF) SYRINGE
PREFILLED_SYRINGE | INTRAVENOUS | Status: DC | PRN
  Administered 2018-06-06: 50 mg via INTRAVENOUS

## 2018-06-06 MED ORDER — LIDOCAINE 2% (20 MG/ML) 5 ML SYRINGE
INTRAMUSCULAR | Status: DC | PRN
  Administered 2018-06-06: 100 mg via INTRAVENOUS

## 2018-06-06 MED ORDER — PROPOFOL 10 MG/ML IV BOLUS
INTRAVENOUS | Status: DC | PRN
  Administered 2018-06-06: 200 mg via INTRAVENOUS

## 2018-06-06 MED ORDER — IOPAMIDOL (ISOVUE-300) INJECTION 61%
INTRAVENOUS | Status: AC
  Filled 2018-06-06: qty 50

## 2018-06-06 MED ORDER — FENTANYL CITRATE (PF) 100 MCG/2ML IJ SOLN
25.0000 ug | INTRAMUSCULAR | Status: DC | PRN
  Administered 2018-06-06: 25 ug via INTRAVENOUS

## 2018-06-06 MED ORDER — MIDAZOLAM HCL 5 MG/5ML IJ SOLN
INTRAMUSCULAR | Status: DC | PRN
  Administered 2018-06-06: 2 mg via INTRAVENOUS

## 2018-06-06 MED ORDER — DEXAMETHASONE SODIUM PHOSPHATE 10 MG/ML IJ SOLN
INTRAMUSCULAR | Status: AC
  Filled 2018-06-06: qty 1

## 2018-06-06 MED ORDER — FENTANYL CITRATE (PF) 250 MCG/5ML IJ SOLN
INTRAMUSCULAR | Status: DC | PRN
  Administered 2018-06-06 (×3): 50 ug via INTRAVENOUS
  Administered 2018-06-06: 100 ug via INTRAVENOUS

## 2018-06-06 MED ORDER — ONDANSETRON HCL 4 MG/2ML IJ SOLN
INTRAMUSCULAR | Status: DC | PRN
  Administered 2018-06-06: 4 mg via INTRAVENOUS

## 2018-06-06 MED ORDER — IOPAMIDOL (ISOVUE-370) INJECTION 76%
100.0000 mL | Freq: Once | INTRAVENOUS | Status: AC | PRN
  Administered 2018-06-06: 100 mL via INTRAVENOUS

## 2018-06-06 MED ORDER — SCOPOLAMINE 1 MG/3DAYS TD PT72
MEDICATED_PATCH | TRANSDERMAL | Status: AC
  Filled 2018-06-06: qty 1

## 2018-06-06 MED ORDER — FENTANYL CITRATE (PF) 100 MCG/2ML IJ SOLN
INTRAMUSCULAR | Status: AC
  Filled 2018-06-06: qty 2

## 2018-06-06 MED ORDER — KETOROLAC TROMETHAMINE 30 MG/ML IJ SOLN
30.0000 mg | Freq: Once | INTRAMUSCULAR | Status: AC | PRN
  Administered 2018-06-06: 30 mg via INTRAVENOUS

## 2018-06-06 MED ORDER — DEXAMETHASONE SODIUM PHOSPHATE 10 MG/ML IJ SOLN
INTRAMUSCULAR | Status: DC | PRN
  Administered 2018-06-06: 10 mg via INTRAVENOUS

## 2018-06-06 MED ORDER — ACETAMINOPHEN 325 MG PO TABS: 650.0000 mg | ORAL_TABLET | Freq: Four times a day (QID) | ORAL | Status: DC | PRN

## 2018-06-06 MED ORDER — BUPIVACAINE-EPINEPHRINE (PF) 0.25% -1:200000 IJ SOLN
INTRAMUSCULAR | Status: AC
  Filled 2018-06-06: qty 30

## 2018-06-06 MED ORDER — OXYCODONE HCL 5 MG PO TABS: 5.0000 mg | ORAL_TABLET | Freq: Four times a day (QID) | ORAL | 0 refills | Status: DC | PRN

## 2018-06-06 MED ORDER — FENTANYL CITRATE (PF) 250 MCG/5ML IJ SOLN
INTRAMUSCULAR | Status: AC
  Filled 2018-06-06: qty 5

## 2018-06-06 MED ORDER — MEPERIDINE HCL 50 MG/ML IJ SOLN: 6.2500 mg | INTRAMUSCULAR | Status: DC | PRN

## 2018-06-06 MED ORDER — BUPIVACAINE-EPINEPHRINE 0.25% -1:200000 IJ SOLN
INTRAMUSCULAR | Status: DC | PRN
  Administered 2018-06-06: 20 mL

## 2018-06-06 MED ORDER — SUGAMMADEX SODIUM 200 MG/2ML IV SOLN
INTRAVENOUS | Status: DC | PRN
  Administered 2018-06-06: 200 mg via INTRAVENOUS

## 2018-06-06 MED ORDER — NITROGLYCERIN 0.4 MG SL SUBL
0.8000 mg | SUBLINGUAL_TABLET | Freq: Once | SUBLINGUAL | Status: AC
  Administered 2018-06-06: 0.8 mg via SUBLINGUAL

## 2018-06-06 MED ORDER — METOCLOPRAMIDE HCL 5 MG/ML IJ SOLN
INTRAMUSCULAR | Status: DC | PRN
  Administered 2018-06-06: 5 mg via INTRAVENOUS

## 2018-06-06 MED ORDER — METOCLOPRAMIDE HCL 5 MG/ML IJ SOLN
INTRAMUSCULAR | Status: AC
  Filled 2018-06-06: qty 2

## 2018-06-06 MED ORDER — SCOPOLAMINE 1 MG/3DAYS TD PT72
MEDICATED_PATCH | TRANSDERMAL | Status: DC | PRN
  Administered 2018-06-06: 1 via TRANSDERMAL

## 2018-06-06 SURGICAL SUPPLY — 39 items
ADH SKN CLS APL DERMABOND .7 (GAUZE/BANDAGES/DRESSINGS) ×1
APPLIER CLIP 5 13 M/L LIGAMAX5 (MISCELLANEOUS) ×2
APR CLP MED LRG 5 ANG JAW (MISCELLANEOUS) ×1
BAG SPEC RTRVL LRG 6X4 10 (ENDOMECHANICALS) ×1
BLADE CLIPPER SURG (BLADE) ×1 IMPLANT
CANISTER SUCT 3000ML PPV (MISCELLANEOUS) ×2 IMPLANT
CHLORAPREP W/TINT 26ML (MISCELLANEOUS) ×2 IMPLANT
CLIP APPLIE 5 13 M/L LIGAMAX5 (MISCELLANEOUS) ×1 IMPLANT
COVER SURGICAL LIGHT HANDLE (MISCELLANEOUS) ×2 IMPLANT
COVER WAND RF STERILE (DRAPES) ×1 IMPLANT
DERMABOND ADVANCED (GAUZE/BANDAGES/DRESSINGS) ×1
DERMABOND ADVANCED .7 DNX12 (GAUZE/BANDAGES/DRESSINGS) ×1 IMPLANT
ELECT REM PT RETURN 9FT ADLT (ELECTROSURGICAL) ×2
ELECTRODE REM PT RTRN 9FT ADLT (ELECTROSURGICAL) ×1 IMPLANT
GLOVE BIO SURGEON STRL SZ 6.5 (GLOVE) ×1 IMPLANT
GLOVE BIOGEL PI IND STRL 7.0 (GLOVE) IMPLANT
GLOVE BIOGEL PI INDICATOR 7.0 (GLOVE) ×1
GLOVE SURG SIGNA 7.5 PF LTX (GLOVE) ×2 IMPLANT
GOWN STRL REUS W/ TWL LRG LVL3 (GOWN DISPOSABLE) ×2 IMPLANT
GOWN STRL REUS W/ TWL XL LVL3 (GOWN DISPOSABLE) ×1 IMPLANT
GOWN STRL REUS W/TWL LRG LVL3 (GOWN DISPOSABLE) ×4
GOWN STRL REUS W/TWL XL LVL3 (GOWN DISPOSABLE) ×2
KIT BASIN OR (CUSTOM PROCEDURE TRAY) ×2 IMPLANT
KIT TURNOVER KIT B (KITS) ×2 IMPLANT
NS IRRIG 1000ML POUR BTL (IV SOLUTION) ×2 IMPLANT
PAD ARMBOARD 7.5X6 YLW CONV (MISCELLANEOUS) ×2 IMPLANT
POUCH SPECIMEN RETRIEVAL 10MM (ENDOMECHANICALS) ×2 IMPLANT
SCISSORS LAP 5X35 DISP (ENDOMECHANICALS) ×2 IMPLANT
SET IRRIG TUBING LAPAROSCOPIC (IRRIGATION / IRRIGATOR) ×2 IMPLANT
SLEEVE ENDOPATH XCEL 5M (ENDOMECHANICALS) ×4 IMPLANT
SPECIMEN JAR SMALL (MISCELLANEOUS) ×2 IMPLANT
SUT MNCRL AB 4-0 PS2 18 (SUTURE) ×2 IMPLANT
SUT VICRYL 0 UR6 27IN ABS (SUTURE) ×1 IMPLANT
TOWEL GREEN STERILE FF (TOWEL DISPOSABLE) ×1 IMPLANT
TRAY LAPAROSCOPIC MC (CUSTOM PROCEDURE TRAY) ×2 IMPLANT
TROCAR XCEL BLUNT TIP 100MML (ENDOMECHANICALS) ×2 IMPLANT
TROCAR XCEL NON-BLD 5MMX100MML (ENDOMECHANICALS) ×2 IMPLANT
TUBING INSUFFLATION (TUBING) ×2 IMPLANT
WATER STERILE IRR 1000ML POUR (IV SOLUTION) ×2 IMPLANT

## 2018-06-06 NOTE — Progress Notes (Signed)
[facility] Progress Note     Subjective: CC-  Admitted over night with worsening upper abdominal pain radiating into her back, as well as pressure-like pain in her chest. U/s shows a 3.8 cm stone at the neck of the gallbladder wall which is not mobile, and gallbladder wall thickening and pericholecystic fluid. LFTs WNL.  She has a history of abnormal EKGs in the past and is followed by Dr. Rennis Golden. Going for coronary CTA this AM prior to surgical intervention.  Objective: Vital signs in last 24 hours: Temp:  [98.5 F (36.9 C)-101.8 F (38.8 C)] 99.7 F (37.6 C) (11/08 0513) Pulse Rate:  [71-100] 90 (11/08 0513) Resp:  [14-17] 16 (11/08 0513) BP: (117-158)/(75-101) 139/83 (11/08 0513) SpO2:  [97 %-100 %] 98 % (11/08 0513) Weight:  [107.5 kg] 107.5 kg (11/07 1444)    Intake/Output from previous day: 11/07 0701 - 11/08 0700 In: 1583 [I.V.:500; IV Piggyback:1083] Out: -  Intake/Output this shift: Total I/O In: 269.8 [I.V.:269.8] Out: -   PE: Gen:  Alert, NAD, pleasant HEENT: EOM's intact, pupils equal and round Card:  RRR Pulm:  CTAB, no W/R/R, effort normal Abd: Soft, +BS, no HSM, no hernia; TTP RUQ, epigastric region, and LUQ without rebound or guarding Psych: A&Ox3  Skin: no rashes noted, warm and dry  Lab Results:  Recent Labs    06/05/18 1434 06/06/18 0500  WBC 12.7* 13.3*  HGB 13.6 13.0  HCT 42.8 40.9  PLT 229 255   BMET Recent Labs    06/05/18 1434 06/06/18 0500  NA 135 135  K 3.8 4.1  CL 101 103  CO2 26 24  GLUCOSE 115* 123*  BUN 8 7  CREATININE 0.76 0.85  CALCIUM 8.8* 8.4*   PT/INR Recent Labs    06/06/18 0500  LABPROT 14.2  INR 1.11   CMP     Component Value Date/Time   NA 135 06/06/2018 0500   K 4.1 06/06/2018 0500   CL 103 06/06/2018 0500   CO2 24 06/06/2018 0500   GLUCOSE 123 (H) 06/06/2018 0500   BUN 7 06/06/2018 0500   CREATININE 0.85 06/06/2018 0500   CALCIUM 8.4 (L) 06/06/2018 0500   PROT 6.4 (L) 06/06/2018  0500   ALBUMIN 3.5 06/06/2018 0500   AST 27 06/06/2018 0500   ALT 27 06/06/2018 0500   ALKPHOS 81 06/06/2018 0500   BILITOT 1.2 06/06/2018 0500   GFRNONAA >60 06/06/2018 0500   GFRAA >60 06/06/2018 0500   Lipase     Component Value Date/Time   LIPASE 29 06/06/2018 0500       Studies/Results: US Abdomen Limited Ruq  Result Date: 06/05/2018 CLINICAL DATA:  Right upper quadrant pain. EXAM: ULTRASOUND ABDOMEN LIMITED RIGHT UPPER QUADRANT COMPARISON:  May 29, 2018 FINDINGS: Gallbladder: There is a 3.8 cm stone in the gallbladder neck which appears to be non mobile. Wall thickening to 7.5 mm and pericholecystic fluid identified. Gallbladder sludge noted. No Murphy's sign. Common bile duct: Diameter: 5.6 mm Liver: A 3.5 x 3 x 2.6 cm hypoechoic mass adjacent to the gallbladder fossa is again identified. Diffuse increased echogenicity in the liver elsewhere. Portal vein is patent on color Doppler imaging with normal direction of blood flow towards the liver. IMPRESSION: 1. There is a 3.8 cm stone at the neck of the gallbladder wall which is not mobile. There is also gallbladder wall thickening and pericholecystic fluid. Gallbladder sludge present. No Murphy's sign. A HIDA scan could further evaluate as clinically warranted. Given the  lack of Murphy's sign, the findings are not definitive but acute cholecystitis is not excluded. 2. The common bile duct is normal in caliber. 3. The mass adjacent the gallbladder fossa is nonspecific but could represent focal fatty sparing. An MRI could better assess. Electronically Signed   By: Gerome Sam III M.D   On: 06/05/2018 15:50    Anti-infectives: Anti-infectives (From admission, onward)   Start     Dose/Rate Route Frequency Ordered Stop   06/06/18 1600  cefTRIAXone (ROCEPHIN) 2 g in sodium chloride 0.9 % 100 mL IVPB    Note to Pharmacy:  She is getting a dose in the ED, schedule next dose for tomorrow   2 g 200 mL/hr over 30 Minutes Intravenous  Every 24 hours 06/05/18 1656     06/05/18 1630  cefTRIAXone (ROCEPHIN) 2 g in sodium chloride 0.9 % 100 mL IVPB     2 g 200 mL/hr over 30 Minutes Intravenous  Once 06/05/18 1620 06/05/18 1704       Assessment/Plan Abnormal EKG - cardiology following HTN   Acute calculous cholecystitis - Continue cardiac workup. If cleared for surgery can plan for laparoscopic cholecystectomy this afternoon. Keep NPO and continue IV rocephin.  ID - rocephin 11/7>> FEN - IVF, NPO VTE - SCDs Foley - none Follow up - TBD   LOS: 0 days    Franne Forts , [facility] 06/06/2018, 9:04 AM Pager: [phone] Mon 7:00 am -11:30 AM Tues-Fri 7:00 am-4:30 pm Sat-Sun 7:00 am-11:30 am

## 2018-06-06 NOTE — Progress Notes (Signed)
Progress Note  Patient Name: Stacy Harris Date of Encounter: 06/06/2018  Primary Cardiologist: Chrystie Nose, MD   Subjective   Drowsy after Dilaudid.  Inpatient Medications    Scheduled Meds: . nitroGLYCERIN       Continuous Infusions: . cefTRIAXone (ROCEPHIN)  IV    . lactated ringers with kcl 100 mL/hr at 06/06/18 0827   PRN Meds: acetaminophen **OR** acetaminophen, diphenhydrAMINE **OR** diphenhydrAMINE, HYDROmorphone (DILAUDID) injection, metoprolol tartrate, ondansetron **OR** ondansetron (ZOFRAN) IV   Vital Signs    Vitals:   06/05/18 2051 06/05/18 2156 06/06/18 0024 06/06/18 0513  BP: (!) 141/82  117/75 139/83  Pulse: 100  71 90  Resp: 16  16 16   Temp: (!) 101.8 F (38.8 C) 100 F (37.8 C) 98.8 F (37.1 C) 99.7 F (37.6 C)  TempSrc: Oral Oral Oral Oral  SpO2: 98%  97% 98%  Weight:      Height:        Intake/Output Summary (Last 24 hours) at 06/06/2018 1015 Last data filed at 06/06/2018 0743 Gross per 24 hour  Intake 1852.84 ml  Output -  Net 1852.84 ml   Filed Weights   06/05/18 1444  Weight: 107.5 kg    Telemetry    NSR - Personally Reviewed  ECG     NSR, prolonged QT interval- Personally Reviewed  Physical Exam   GEN: No acute distress.   Neck: No JVD Cardiac: RRR, no murmurs, rubs, or gallops.  Respiratory: Clear to auscultation bilaterally.  MS: No edema; No deformity. 2+ right radial pulse Neuro:  Nonfocal  Psych: Normal affect   Labs    Chemistry Recent Labs  Lab 06/05/18 1434 06/06/18 0500  NA 135 135  K 3.8 4.1  CL 101 103  CO2 26 24  GLUCOSE 115* 123*  BUN 8 7  CREATININE 0.76 0.85  CALCIUM 8.8* 8.4*  PROT 6.8 6.4*  ALBUMIN 3.9 3.5  AST 14* 27  ALT 12 27  ALKPHOS 85 81  BILITOT 0.7 1.2  GFRNONAA >60 >60  GFRAA >60 >60  ANIONGAP 8 8     Hematology Recent Labs  Lab 06/05/18 1434 06/06/18 0500  WBC 12.7* 13.3*  RBC 4.69 4.52  HGB 13.6 13.0  HCT 42.8 40.9  MCV 91.3 90.5  MCH 29.0 28.8  MCHC  31.8 31.8  RDW 13.6 14.2  PLT 229 255    Cardiac EnzymesNo results for input(s): TROPONINI in the last 168 hours. No results for input(s): TROPIPOC in the last 168 hours.   BNPNo results for input(s): BNP, PROBNP in the last 168 hours.   DDimer No results for input(s): DDIMER in the last 168 hours.   Radiology    Ct Coronary Morph W/cta Cor W/score W/ca W/cm &/or Wo/cm  Result Date: 06/06/2018 CLINICAL DATA:  52 year old female with chest pain, scheduled for cholecystectomy. EXAM: Cardiac/Coronary  CT TECHNIQUE: The patient was scanned on a [employer]. FINDINGS: A 120 kV prospective scan was triggered in the descending thoracic aorta at 111 HU's. Axial non-contrast 3 mm slices were carried out through the heart. The data set was analyzed on a dedicated work station and scored using the Agatson method. Gantry rotation speed was 250 msecs and collimation was .6 mm. No beta blockade and 0.8 mg of sl NTG was given. The 3D data set was reconstructed in 5% intervals of the 67-82 % of the R-R cycle. Diastolic phases were analyzed on a dedicated work station using MPR, MIP and VRT modes.  The patient received 80 cc of contrast. Aorta:  Normal size.  No calcifications.  No dissection. Aortic Valve:  Trileaflet.  No calcifications. Coronary Arteries:  Normal coronary origin.  Right dominance. RCA is a very large dominant artery that gives rise to PDA and PLVB. There is no plaque. Left main is a large artery that gives rise to LAD and LCX arteries. Left main has no plaque. LAD is a large vessel that gives rise to two small diagonal arteries and wraps around the apex. There is no plaque. LCX is a non-dominant artery that gives rise to one large OM1 branch. There is no plaque. Other findings: Normal pulmonary vein drainage into the left atrium. Normal let atrial appendage without a thrombus. Normal size of the pulmonary artery. IMPRESSION: 1. Coronary calcium score of 0. This was 0 percentile for age  and sex matched control. 2. Normal coronary origin with right dominance. 3. No evidence of CAD. Electronically Signed   By: Tobias Alexander   On: 06/06/2018 09:31   US Abdomen Limited Ruq  Result Date: 06/05/2018 CLINICAL DATA:  Right upper quadrant pain. EXAM: ULTRASOUND ABDOMEN LIMITED RIGHT UPPER QUADRANT COMPARISON:  May 29, 2018 FINDINGS: Gallbladder: There is a 3.8 cm stone in the gallbladder neck which appears to be non mobile. Wall thickening to 7.5 mm and pericholecystic fluid identified. Gallbladder sludge noted. No Murphy's sign. Common bile duct: Diameter: 5.6 mm Liver: A 3.5 x 3 x 2.6 cm hypoechoic mass adjacent to the gallbladder fossa is again identified. Diffuse increased echogenicity in the liver elsewhere. Portal vein is patent on color Doppler imaging with normal direction of blood flow towards the liver. IMPRESSION: 1. There is a 3.8 cm stone at the neck of the gallbladder wall which is not mobile. There is also gallbladder wall thickening and pericholecystic fluid. Gallbladder sludge present. No Murphy's sign. A HIDA scan could further evaluate as clinically warranted. Given the lack of Murphy's sign, the findings are not definitive but acute cholecystitis is not excluded. 2. The common bile duct is normal in caliber. 3. The mass adjacent the gallbladder fossa is nonspecific but could represent focal fatty sparing. An MRI could better assess. Electronically Signed   By: Gerome Sam III M.D   On: 06/05/2018 15:50    Cardiac Studies   CT showed no CAD  Patient Profile     52 y.o. female with acute cholecystitis  Assessment & Plan    Noncardiac chest pain.  Coronary CT negative.  No further testing needed before surgery. [facility] will sign off.   Medication Recommendations:  Preventive therapy Other recommendations (labs, testing, etc):  none Follow up as an outpatient:  None at this time  For questions or updates, please contact [facility] Please consult  [email or web address] for contact info under        Signed, Lance Muss, MD  06/06/2018, 10:15 AM

## 2018-06-06 NOTE — Op Note (Signed)
Laparoscopic Cholecystectomy Procedure Note  Indications: This patient presents with symptomatic gallbladder disease and will undergo laparoscopic cholecystectomy.  Pre-operative Diagnosis: acute cholecystitis with cholelithiasis  Post-operative Diagnosis: Same  Surgeon: Stacy Miyamoto A   Assistants: 0  Anesthesia: General endotracheal anesthesia  ASA Class: 2  Procedure Details  The patient was seen again in the Holding Room. The risks, benefits, complications, treatment options, and expected outcomes were discussed with the patient. The possibilities of reaction to medication, pulmonary aspiration, perforation of viscus, bleeding, recurrent infection, finding a normal gallbladder, the need for additional procedures, failure to diagnose a condition, the possible need to convert to an open procedure, and creating a complication requiring transfusion or operation were discussed with the patient. The likelihood of improving the patient's symptoms with return to their baseline status is good.  The patient and/or family concurred with the proposed plan, giving informed consent. The site of surgery properly noted. The patient was taken to Operating Room, identified as Stacy Harris and the procedure verified as Laparoscopic Cholecystectomy with Intraoperative Cholangiogram. A Time Out was held and the above information confirmed.  Prior to the induction of general anesthesia, antibiotic prophylaxis was administered. General endotracheal anesthesia was then administered and tolerated well. After the induction, the abdomen was prepped with Chloraprep and draped in sterile fashion. The patient was positioned in the supine position.  Local anesthetic agent was injected into the skin near the umbilicus and an incision made. We dissected down to the abdominal fascia with blunt dissection.  The fascia was incised vertically and we entered the peritoneal cavity bluntly.  A pursestring suture of 0-Vicryl  was placed around the fascial opening.  The Hasson cannula was inserted and secured with the stay suture.  Pneumoperitoneum was then created with CO2 and tolerated well without any adverse changes in the patient's vital signs. A 5-mm port was placed in the subxiphoid position.  Two 5-mm ports were placed in the right upper quadrant. All skin incisions were infiltrated with a local anesthetic agent before making the incision and placing the trocars.   We positioned the patient in reverse Trendelenburg, tilted slightly to the patient's left. The patient's liver appeared normal.  The gallbladder was acutely inflamed and distended.  I had to aspirate bile from it just to grasp it.  The gallbladder  fundus grasped and retracted cephalad. Adhesions were lysed bluntly and with the electrocautery where indicated, taking care not to injure any adjacent organs or viscus. The infundibulum was grasped and retracted laterally, exposing the peritoneum overlying the triangle of Calot. This was then divided and exposed in a blunt fashion. The cystic duct was clearly identified and bluntly dissected circumferentially. A critical view of the cystic duct and cystic artery was obtained.  The cystic duct was then ligated with clips and divided. The cystic artery was, dissected free, ligated with clips and divided as well.   The gallbladder was dissected from the liver bed in retrograde fashion with the electrocautery. The gallbladder was removed and placed in an Endocatch sac. The liver bed was irrigated and inspected. Hemostasis was achieved with the electrocautery. Copious irrigation was utilized and was repeatedly aspirated until clear.  The gallbladder and Endocatch sac were then removed through the umbilical port site.  The pursestring suture was used to close the umbilical fascia.    We again inspected the right upper quadrant for hemostasis.  Pneumoperitoneum was released as we removed the trocars.  4-0 Monocryl was used to  close the skin.  Skin glue was then applied. The patient was then extubated and brought to the recovery room in stable condition. Instrument, sponge, and needle counts were correct at closure and at the conclusion of the case.   Findings: Cholecystitis with Cholelithiasis  Estimated Blood Loss: Minimal         Drains: 0         Specimens: Gallbladder           Complications: None; patient tolerated the procedure well.         Disposition: PACU - hemodynamically stable.         Condition: stable

## 2018-06-06 NOTE — Progress Notes (Signed)
Patient ID: Stacy Harris, female   DOB: 12/31/1965, 52 y.o.   MRN: [id number]  Pre Procedure note for inpatients:   Stacy Harris has been scheduled for Procedure(s): LAPAROSCOPIC CHOLECYSTECTOMY WITH INTRAOPERATIVE CHOLANGIOGRAM (N/A) today. The various methods of treatment have been discussed with the patient. After consideration of the risks, benefits and treatment options the patient has consented to the planned procedure.   The patient has been seen and labs reviewed. There are no changes in the patient's condition to prevent proceeding with the planned procedure today.  Recent labs:  Lab Results  Component Value Date   WBC 13.3 (H) 06/06/2018   HGB 13.0 06/06/2018   HCT 40.9 06/06/2018   PLT 255 06/06/2018   GLUCOSE 123 (H) 06/06/2018   ALT 27 06/06/2018   AST 27 06/06/2018   NA 135 06/06/2018   K 4.1 06/06/2018   CL 103 06/06/2018   CREATININE 0.85 06/06/2018   BUN 7 06/06/2018   CO2 24 06/06/2018   INR 1.11 06/06/2018    Maudry Zeidan A, MD 06/06/2018 11:46 AM

## 2018-06-06 NOTE — Anesthesia Preprocedure Evaluation (Signed)
Anesthesia Evaluation  Patient identified by MRN, date of birth, ID band Patient awake    Reviewed: Allergy & Precautions, NPO status , Patient's Chart, lab work & pertinent test results  History of Anesthesia Complications (+) PONV and history of anesthetic complications  Airway Mallampati: I       Dental no notable dental hx. (+) Teeth Intact   Pulmonary neg pulmonary ROS,    Pulmonary exam normal breath sounds clear to auscultation       Cardiovascular hypertension, Normal cardiovascular exam Rhythm:Regular Rate:Normal     Neuro/Psych negative psych ROS   GI/Hepatic negative GI ROS, Neg liver ROS,   Endo/Other    Renal/GU negative Renal ROS  negative genitourinary   Musculoskeletal   Abdominal (+) + obese,   Peds  Hematology   Anesthesia Other Findings   Reproductive/Obstetrics                             Anesthesia Physical Anesthesia Plan  ASA: II  Anesthesia Plan: General   Post-op Pain Management:    Induction: Intravenous  PONV Risk Score and Plan: 4 or greater and Ondansetron, Dexamethasone, Midazolam, Scopolamine patch - Pre-op and Promethazine  Airway Management Planned: Oral ETT  Additional Equipment:   Intra-op Plan:   Post-operative Plan: Extubation in OR  Informed Consent: I have reviewed the patients History and Physical, chart, labs and discussed the procedure including the risks, benefits and alternatives for the proposed anesthesia with the patient or authorized representative who has indicated his/her understanding and acceptance.   Dental advisory given  Plan Discussed with: CRNA  Anesthesia Plan Comments:         Anesthesia Quick Evaluation

## 2018-06-06 NOTE — Anesthesia Procedure Notes (Signed)
Procedure Name: Intubation Date/Time: 06/06/2018 11:58 AM Performed by: Myna Bright, CRNA Pre-anesthesia Checklist: Patient identified, Emergency Drugs available, Suction available and Patient being monitored Patient Re-evaluated:Patient Re-evaluated prior to induction Oxygen Delivery Method: Circle system utilized Preoxygenation: Pre-oxygenation with 100% oxygen Induction Type: IV induction Ventilation: Mask ventilation without difficulty Laryngoscope Size: Mac and 3 Grade View: Grade I Tube type: Oral Tube size: 7.0 mm Number of attempts: 1 Airway Equipment and Method: Stylet Placement Confirmation: ETT inserted through vocal cords under direct vision,  positive ETCO2 and breath sounds checked- equal and bilateral Secured at: 21 cm Tube secured with: Tape Dental Injury: Teeth and Oropharynx as per pre-operative assessment

## 2018-06-06 NOTE — Progress Notes (Signed)
[facility] floor coverage paged for patients fever spike to 101.8. Informed temperature returned to 100 after given Tylenol.

## 2018-06-07 NOTE — Progress Notes (Signed)
Stacy Harris to be D/C'd  per MD order. Discussed with the patient and all questions fully answered.  VSS, Skin clean, dry and intact without evidence of skin break down, no evidence of skin tears noted.  IV catheter discontinued intact. Site without signs and symptoms of complications. Dressing and pressure applied.  An After Visit Summary was printed and given to the patient. Patient received prescription.  D/c education completed with patient/family including follow up instructions, medication list, d/c activities limitations if indicated, with other d/c instructions as indicated by MD - patient able to verbalize understanding, all questions fully answered.   Patient instructed to return to ED, call 911, or call MD for any changes in condition.   Patient to be escorted via WC, and D/C home via private auto.

## 2018-06-07 NOTE — Progress Notes (Addendum)
  Doing well POD 1 lap chole. No n/v.  Passing gas.  Pain controlled. Ambulatory. afvss Incisions c/d/i. Home after lunch due to transportation issues.

## 2018-06-07 NOTE — Discharge Instructions (Signed)
[facility] ______[facility] °LAPAROSCOPIC SURGERY: POST OP INSTRUCTIONS °Always review your discharge instruction sheet given to you by the facility where your surgery was performed. °IF YOU HAVE DISABILITY OR FAMILY LEAVE FORMS, YOU MUST BRING THEM TO THE OFFICE FOR PROCESSING.   °DO NOT GIVE THEM TO YOUR DOCTOR. ° °1. A prescription for pain medication may be given to you upon discharge.  Take your pain medication as prescribed, if needed.  If narcotic pain medicine is not needed, then you may take acetaminophen (Tylenol) or ibuprofen (Advil) as needed. °2. Take your usually prescribed medications unless otherwise directed. °3. If you need a refill on your pain medication, please contact your pharmacy.  They will contact our office to request authorization. Prescriptions will not be filled after 5pm or on week-ends. °4. You should follow a light diet the first few days after arrival home, such as soup and crackers, etc.  Be sure to include lots of fluids daily. °5. Most patients will experience some swelling and bruising in the area of the incisions.  Ice packs will help.  Swelling and bruising can take several days to resolve.  °6. It is common to experience some constipation if taking pain medication after surgery.  Increasing fluid intake and taking a stool softener (such as Colace) will usually help or prevent this problem from occurring.  A mild laxative (Milk of Magnesia or Miralax) should be taken according to package instructions if there are no bowel movements after 48 hours. °7. Unless discharge instructions indicate otherwise, you may remove your bandages 24-48 hours after surgery, and you may shower at that time.  You may have steri-strips (small skin tapes) in place directly over the incision.  These strips should be left on the skin for 7-10 days.  If your surgeon used skin glue on the incision, you may shower in 24 hours.  The glue will flake off over the next 2-3 weeks.  Any sutures or  staples will be removed at the office during your follow-up visit. °8. ACTIVITIES:  You may resume regular (light) daily activities beginning the next day--such as daily self-care, walking, climbing stairs--gradually increasing activities as tolerated.  You may have sexual intercourse when it is comfortable.  Refrain from any heavy lifting or straining until approved by your doctor. °a. You may drive when you are no longer taking prescription pain medication, you can comfortably wear a seatbelt, and you can safely maneuver your car and apply brakes. °b. RETURN TO WORK:  __________________________________________________________ °9. You should see your doctor in the office for a follow-up appointment approximately 2-3 weeks after your surgery.  Make sure that you call for this appointment within a day or two after you arrive home to insure a convenient appointment time. °10. OTHER INSTRUCTIONS: __________________________________________________________________________________________________________________________ __________________________________________________________________________________________________________________________ °WHEN TO CALL YOUR DOCTOR: °1. Fever over 101.0 °2. Inability to urinate °3. Continued bleeding from incision. °4. Increased pain, redness, or drainage from the incision. °5. Increasing abdominal pain ° °The clinic staff is available to answer your questions during regular business hours.  Please don’t hesitate to call and ask to speak to one of the nurses for clinical concerns.  If you have a medical emergency, go to the nearest emergency room or call 911.  A surgeon from [facility] is always on call at the hospital. °[address], [address], [place], [place]  [address] ? [address] [address], [place], [place]   [address] °[phone] ? [phone] ? FAX [phone] °Web site:   [email or web address] ° ° °Laparoscopic Cholecystectomy, Care After °This sheet  gives you information about how to care for yourself after your procedure. Your health care provider may also give you more specific instructions. If you have problems or questions, contact your health care provider. °What can I expect after the procedure? °After the procedure, it is common to have: °· Pain at your incision sites. You will be given medicines to control this pain. °· Mild nausea or vomiting. °· Bloating and possible shoulder pain from the air-like gas that was used during the procedure. ° °Follow these instructions at home: °Incision care ° °· Follow instructions from your health care provider about how to take care of your incisions. Make sure you: °? Wash your hands with soap and water before you change your bandage (dressing). If soap and water are not available, use hand sanitizer. °? Change your dressing as told by your health care provider. °? Leave stitches (sutures), skin glue, or adhesive strips in place. These skin closures may need to be in place for 2 weeks or longer. If adhesive strip edges start to loosen and curl up, you may trim the loose edges. Do not remove adhesive strips completely unless your health care provider tells you to do that. °· Do not take baths, swim, or use a hot tub until your health care provider approves. Ask your health care provider if you can take showers. You may only be allowed to take sponge baths for bathing. °· Check your incision area every day for signs of infection. Check for: °? More redness, swelling, or pain. °? More fluid or blood. °? Warmth. °? Pus or a bad smell. °Activity °· Do not drive or use heavy machinery while taking prescription pain medicine. °· Do not lift anything that is heavier than 10 lb (4.5 kg) until your health care provider approves. °· Do not play contact sports until your health care provider approves. °· Do not drive for 24 hours if you were given a medicine to help you relax (sedative). °· Rest as needed. Do not return to work  or school until your health care provider approves. °General instructions °· Take over-the-counter and prescription medicines only as told by your health care provider. °· To prevent or treat constipation while you are taking prescription pain medicine, your health care provider may recommend that you: °? Drink enough fluid to keep your urine clear or pale yellow. °? Take over-the-counter or prescription medicines. °? Eat foods that are high in fiber, such as fresh fruits and vegetables, whole grains, and beans. °? Limit foods that are high in fat and processed sugars, such as fried and sweet foods. °Contact a health care provider if: °· You develop a rash. °· You have more redness, swelling, or pain around your incisions. °· You have more fluid or blood coming from your incisions. °· Your incisions feel warm to the touch. °· You have pus or a bad smell coming from your incisions. °· You have a fever. °· One or more of your incisions breaks open. °Get help right away if: °· You have trouble breathing. °· You have chest pain. °· You have increasing pain in your shoulders. °· You faint or feel dizzy when you stand. °· You have severe pain in your abdomen. °· You have nausea or vomiting that lasts for more than one day. °· You have leg pain. °This information is not intended to replace advice given to you by your health care provider. Make sure you   discuss any questions you have with your health care provider. °Document Released: 07/16/2005 Document Revised: 02/04/2016 Document Reviewed: 01/02/2016 °Elsevier Interactive Patient Education © 2018 [employer] ° °

## 2018-06-09 ENCOUNTER — Encounter (HOSPITAL_COMMUNITY): Payer: Self-pay | Admitting: Surgery

## 2018-06-09 ENCOUNTER — Other Ambulatory Visit: Payer: Self-pay | Admitting: Surgery

## 2018-06-09 NOTE — Anesthesia Postprocedure Evaluation (Signed)
Anesthesia Post Note  Patient: Stacy Harris  Procedure(s) Performed: LAPAROSCOPIC CHOLECYSTECTOMY (N/A Abdomen)     Patient location during evaluation: PACU Anesthesia Type: General Level of consciousness: awake Pain management: pain level controlled Vital Signs Assessment: post-procedure vital signs reviewed and stable Respiratory status: spontaneous breathing Cardiovascular status: stable Postop Assessment: no apparent nausea or vomiting Anesthetic complications: no    Last Vitals:  Vitals:   06/06/18 2130 06/07/18 0544  BP: 124/80 (!) 150/84  Pulse: 69 71  Resp: 18 17  Temp: 36.8 C (!) 36.4 C  SpO2: 94% 95%    Last Pain:  Vitals:   06/07/18 0754  TempSrc:   PainSc: 2    Pain Goal: Patients Stated Pain Goal: 3 (06/06/18 1318)               Caren Macadam

## 2018-06-09 NOTE — Transfer of Care (Signed)
Immediate Anesthesia Transfer of Care Note  Patient: Stacy Harris  Procedure(s) Performed: LAPAROSCOPIC CHOLECYSTECTOMY (N/A Abdomen)  Patient Location: PACU  Anesthesia Type:General  Level of Consciousness: Sedated  Airway & Oxygen Therapy: Natural,nasal  Cannula at 2l   Post-op Assessment: Stable  Post vital signs: See PACU VS Flowsheet  Last Vitals:  Vitals Value Taken Time  BP    Temp    Pulse    Resp    SpO2  No appaerent    Last Pain:  Vitals:   06/07/18 0754  TempSrc:   PainSc: 2       Patients Stated Pain Goal: 3 (06/06/18 1318)  Complications: No apparent anesthesia complications

## 2018-06-10 NOTE — Discharge Summary (Signed)
  [facility] Discharge Summary   Patient ID: Stacy Harris MRN: [id number] DOB/AGE: [birth year] 50 y.o.  Admit date: 06/05/2018 Discharge date: 06/07/2018  Admitting Diagnosis: Acute cholecystitis  Discharge Diagnosis Patient Active Problem List   Diagnosis Date Noted  . Cholelithiasis with acute cholecystitis 06/05/2018  . Precordial chest pain 06/03/2018  . Pre-procedure lab exam 06/03/2018  . Sepsis (HCC) 05/05/2016  . Iron deficiency anemia 05/05/2016  . Diverticulitis of large intestine with perforation without abscess or bleeding   . Abnormal EKG 06/16/2015  . Essential hypertension 06/16/2015    Consultants Cardiology  Imaging: No results found.  Procedures Dr. Magnus Ivan (06/06/18) - Laparoscopic Cholecystectomy  Hospital Course:  Stacy Harris is a 52yo female who presented to [facility] 11/7 with worsening/more frequent abdominal/chest pain. Workup showed Acute calculous cholecystitis. Cardiology was consulted due to h/o abdominal EKGs; CT coronary ordered and patient cleared for surgery.  Patient was admitted and underwent procedure listed above.  Tolerated procedure well and was transferred to the floor.  Diet was advanced as tolerated.  On POD1 the patient was tolerating diet, ambulating well, pain well controlled, vital signs stable, incisions c/d/i and felt stable for discharge home.  Patient will follow up as below and knows to call with questions or concerns.      Allergies as of 06/07/2018      Reactions   Codeine Rash      Medication List    TAKE these medications   acetaminophen 325 MG tablet Commonly known as:  TYLENOL Take 2 tablets (650 mg total) by mouth every 6 (six) hours as needed for mild pain.   ALIGN PO Take 1 tablet by mouth every morning.   Clindamycin-Benzoyl Per (Refr) gel Apply 1 application topically every morning.   doxycycline 100 MG tablet Commonly known as:  VIBRA-TABS Take 1 tablet by mouth 2 (two) times daily.    FIBER ADULT GUMMIES PO Take 1 tablet by mouth daily.   ibuprofen 200 MG tablet Commonly known as:  ADVIL,MOTRIN Take 800 mg by mouth every 6 (six) hours as needed for moderate pain.   metoprolol tartrate 100 MG tablet Commonly known as:  LOPRESSOR Take ONE tablet TWO HOURS prior to test.   multivitamin with minerals Tabs tablet Take 1 tablet by mouth every morning.   oxyCODONE 5 MG immediate release tablet Commonly known as:  Oxy IR/ROXICODONE Take 1-2 tablets (5-10 mg total) by mouth every 6 (six) hours as needed for severe pain.        Follow-up Information    [facility] Follow up on 06/17/2018.   Specialty:  General Surgery Why:  Your appointment is at 9:45 AM. Be at the office 30 minutes early for check in.  Bring photo ID and insurance information.   Contact information: [address] [place] [address] [phone]           Signed: Franne Forts, [facility] 06/10/2018, 10:14 AM Pager: [phone] Mon 7:00 am -11:30 AM Tues-Fri 7:00 am-4:30 pm Sat-Sun 7:00 am-11:30 am

## 2018-06-17 ENCOUNTER — Encounter: Payer: Self-pay | Admitting: Pulmonary Disease

## 2018-06-17 ENCOUNTER — Ambulatory Visit (INDEPENDENT_AMBULATORY_CARE_PROVIDER_SITE_OTHER): Payer: 59 | Admitting: Pulmonary Disease

## 2018-06-17 VITALS — BP 128/82 | HR 79 | Ht 65.5 in | Wt 236.4 lb

## 2018-06-17 DIAGNOSIS — G4733 Obstructive sleep apnea (adult) (pediatric): Secondary | ICD-10-CM | POA: Diagnosis not present

## 2018-06-17 NOTE — Patient Instructions (Signed)
Moderate probability of significant sleep disordered breathing  Daytime sleepiness  We will set you up with a home sleep study  I will see you back in the office in about 3 months Continue weight loss efforts, regular exercises   Sleep Apnea Sleep apnea is a condition in which breathing pauses or becomes shallow during sleep. Episodes of sleep apnea usually last 10 seconds or longer, and they may occur as many as 20 times an hour. Sleep apnea disrupts your sleep and keeps your body from getting the rest that it needs. This condition can increase your risk of certain health problems, including:  Heart attack.  Stroke.  Obesity.  Diabetes.  Heart failure.  Irregular heartbeat.  There are three kinds of sleep apnea:  Obstructive sleep apnea. This kind is caused by a blocked or collapsed airway.  Central sleep apnea. This kind happens when the part of the brain that controls breathing does not send the correct signals to the muscles that control breathing.  Mixed sleep apnea. This is a combination of obstructive and central sleep apnea.  What are the causes? The most common cause of this condition is a collapsed or blocked airway. An airway can collapse or become blocked if:  Your throat muscles are abnormally relaxed.  Your tongue and tonsils are larger than normal.  You are overweight.  Your airway is smaller than normal.  What increases the risk? This condition is more likely to develop in people who:  Are overweight.  Smoke.  Have a smaller than normal airway.  Are elderly.  Are female.  Drink alcohol.  Take sedatives or tranquilizers.  Have a family history of sleep apnea.  What are the signs or symptoms? Symptoms of this condition include:  Trouble staying asleep.  Daytime sleepiness and tiredness.  Irritability.  Loud snoring.  Morning headaches.  Trouble concentrating.  Forgetfulness.  Decreased interest in sex.  Unexplained  sleepiness.  Mood swings.  Personality changes.  Feelings of depression.  Waking up often during the night to urinate.  Dry mouth.  Sore throat.  How is this diagnosed? This condition may be diagnosed with:  A medical history.  A physical exam.  A series of tests that are done while you are sleeping (sleep study). These tests are usually done in a sleep lab, but they may also be done at home.  How is this treated? Treatment for this condition aims to restore normal breathing and to ease symptoms during sleep. It may involve managing health issues that can affect breathing, such as high blood pressure or obesity. Treatment may include:  Sleeping on your side.  Using a decongestant if you have nasal congestion.  Avoiding the use of depressants, including alcohol, sedatives, and narcotics.  Losing weight if you are overweight.  Making changes to your diet.  Quitting smoking.  Using a device to open your airway while you sleep, such as: ? An oral appliance. This is a custom-made mouthpiece that shifts your lower jaw forward. ? A continuous positive airway pressure (CPAP) device. This device delivers oxygen to your airway through a mask. ? A nasal expiratory positive airway pressure (EPAP) device. This device has valves that you put into each nostril. ? A bi-level positive airway pressure (BPAP) device. This device delivers oxygen to your airway through a mask.  Surgery if other treatments do not work. During surgery, excess tissue is removed to create a wider airway.  It is important to get treatment for sleep apnea. Without treatment,  this condition can lead to:  High blood pressure.  Coronary artery disease.  (Men) An inability to achieve or maintain an erection (impotence).  Reduced thinking abilities.  Follow these instructions at home:  Make any lifestyle changes that your health care provider recommends.  Eat a healthy, well-balanced diet.  Take  over-the-counter and prescription medicines only as told by your health care provider.  Avoid using depressants, including alcohol, sedatives, and narcotics.  Take steps to lose weight if you are overweight.  If you were given a device to open your airway while you sleep, use it only as told by your health care provider.  Do not use any tobacco products, such as cigarettes, chewing tobacco, and e-cigarettes. If you need help quitting, ask your health care provider.  Keep all follow-up visits as told by your health care provider. This is important. Contact a health care provider if:  The device that you received to open your airway during sleep is uncomfortable or does not seem to be working.  Your symptoms do not improve.  Your symptoms get worse. Get help right away if:  You develop chest pain.  You develop shortness of breath.  You develop discomfort in your back, arms, or stomach.  You have trouble speaking.  You have weakness on one side of your body.  You have drooping in your face. These symptoms may represent a serious problem that is an emergency. Do not wait to see if the symptoms will go away. Get medical help right away. Call your local emergency services (911 in the [place] Do not drive yourself to the hospital. This information is not intended to replace advice given to you by your health care provider. Make sure you discuss any questions you have with your health care provider. Document Released: 07/06/2002 Document Revised: 03/11/2016 Document Reviewed: 04/25/2015 Elsevier Interactive Patient Education  [employer].
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
## 2018-06-17 NOTE — Progress Notes (Signed)
   Subjective:    Patient ID: Stacy Harris, female    DOB: 03/18/1966, 52 y.o.   MRN: [id number]  HPI    Review of Systems  Constitutional: Positive for fever. Negative for unexpected weight change.  HENT: Positive for congestion, nosebleeds, sinus pressure and trouble swallowing. Negative for dental problem, ear pain, postnasal drip, rhinorrhea, sneezing and sore throat.   Eyes: Negative.  Negative for redness and itching.  Respiratory: Positive for cough, chest tightness and shortness of breath.   Cardiovascular: Negative.  Negative for palpitations and leg swelling.  Gastrointestinal: Negative for nausea and vomiting.  Endocrine: Negative.   Genitourinary: Negative.  Negative for dysuria.  Musculoskeletal: Negative.  Negative for joint swelling.  Skin: Negative.  Negative for rash.  Allergic/Immunologic: Negative.  Negative for environmental allergies, food allergies and immunocompromised state.  Neurological: Negative.  Negative for headaches.  Hematological: Negative.  Does not bruise/bleed easily.  Psychiatric/Behavioral: The patient is nervous/anxious.        Objective:   Physical Exam        Assessment & Plan:

## 2018-07-08 ENCOUNTER — Other Ambulatory Visit: Payer: Self-pay | Admitting: *Deleted

## 2018-07-08 DIAGNOSIS — G4733 Obstructive sleep apnea (adult) (pediatric): Secondary | ICD-10-CM | POA: Diagnosis not present

## 2018-07-18 ENCOUNTER — Telehealth: Payer: Self-pay | Admitting: Pulmonary Disease

## 2018-07-18 DIAGNOSIS — G4733 Obstructive sleep apnea (adult) (pediatric): Secondary | ICD-10-CM

## 2018-07-18 NOTE — Telephone Encounter (Signed)
Sleep position optimization by encouraging sleep in the lateral poittion , eveating the head of the bed by 30 degrees may help. Cpap therapy maybe consider if the patient has significant daytime sympotoms  That can be ascribed to  An effect of sleep disordered breathing   Treatment options maybe CPAP with the settings auto 5 to 15.         Regular exercise will help promote good quality sleep  .  Weight loss advised  Advise against driving while sleepy & against medication with sedative side effects.    Make appointment for 3 months for compliance with download with Dr. Wynona Neatlalere. If patient choose a cpap therapy. Other wise as needed. LMOM to call back

## 2018-07-21 NOTE — Telephone Encounter (Signed)
lmom 

## 2018-07-28 DIAGNOSIS — G4733 Obstructive sleep apnea (adult) (pediatric): Secondary | ICD-10-CM | POA: Diagnosis not present

## 2018-07-28 NOTE — Telephone Encounter (Signed)
Patient returned call, CB is [phone].

## 2018-07-28 NOTE — Telephone Encounter (Signed)
Called and spoke with patient she is aware and verbalized understanding. Patient wants to do the CPAP therapy but is calling her insurance company to see who will cover it before we place the order. Order pending will await phone call.

## 2018-07-28 NOTE — Telephone Encounter (Signed)
Pt is calling back [phone]

## 2018-07-28 NOTE — Telephone Encounter (Signed)
[facility] and [employer] are in network with insurance, was advised to have order state that patient will self-pay and file for reimbursement so she can receive some coverage through her insurance d/t it being so late in the calendar year.  [facility] with the above listed.    Provided patient with number to call [facility] to answer specific questions she had about the cost of the machine being ordered, as I have no way of answering these cost questions.  Pt expressed understanding.  Nothing further needed.

## 2018-07-29 ENCOUNTER — Telehealth: Payer: Self-pay | Admitting: Pulmonary Disease

## 2018-07-29 NOTE — Telephone Encounter (Signed)
Patient is needing to pick up CPAP today (see previous msg from yesterday).  Patient is wanting to know if we can fax this and if she can pick up a hard copy of the RX as well since we close at noon and just in case something happens after we close.

## 2018-07-29 NOTE — Telephone Encounter (Signed)
Will this message to the PCCs.   PCCs, please advise. Thanks!

## 2018-07-29 NOTE — Telephone Encounter (Signed)
I have faxed this order and sent it thru community message I have also given patient a copy

## 2018-08-11 ENCOUNTER — Encounter: Payer: Self-pay | Admitting: Internal Medicine

## 2018-08-11 ENCOUNTER — Ambulatory Visit (INDEPENDENT_AMBULATORY_CARE_PROVIDER_SITE_OTHER): Payer: 59 | Admitting: Internal Medicine

## 2018-08-11 VITALS — BP 142/90 | HR 86 | Ht 65.5 in | Wt 238.4 lb

## 2018-08-11 DIAGNOSIS — Z01812 Encounter for preprocedural laboratory examination: Secondary | ICD-10-CM

## 2018-08-11 DIAGNOSIS — I10 Essential (primary) hypertension: Secondary | ICD-10-CM | POA: Diagnosis not present

## 2018-08-11 DIAGNOSIS — R072 Precordial pain: Secondary | ICD-10-CM

## 2018-08-11 NOTE — Progress Notes (Signed)
OFFICE NOTE  Chief Complaint:  Follow-up coronary CT  Primary Care Physician: Roger KillWilliams, Breejante J, PA-C  HPI:  Stacy Harris is a pleasant 53 year old female who is currently referred for evaluation of abnormal EKG. Recently she found out her sister had uterine cancer and had some abnormalities on her uterine exam. She then went in for a hysteroscopy and partial D&C. Fortunately there was no evidence for cancer, but preoperatively she had an EKG which was abnormal demonstrating a poor R-wave progression anteriorly and possible anterior infarct. She was then referred to cardiology for evaluation. She cannot recall any prior event that sounded like an acute MI. She does have a history of hypertension although no other known coronary problems. There is no significant coronary disease in her family, more likely cancer. She denies any significant shortness of breath but does get short of breath walking up stairs, which she contributes her recent weight gain. There is some occasional sharp chest discomfort which is not necessarily associated with exertion or relieved by rest. She's not had high cholesterol or diabetes and is a nonsmoker.  08/11/2015  Stacy Harris returns today for follow-up. She underwent a exercise treadmill stress test. This was negative for ischemia although she had a hypertensive response to exercise. I suspect her symptoms may be related to weight and lack of exercise. We discussed this at length today and I feel confident that she could start a more active exercise program.  06/03/2018   Stacy Harris is seen today in follow-up.  I last saw her in 2017 at which time she was having symptoms of shortness of breath, particularly walking upstairs and occasional sharp chest discomfort.  She had EKG abnormalities of poor R wave progression and underwent exercise treadmill stress testing which was negative.  She had done pretty well until recently when she has had 2 significant episodes of  chest discomfort.  She describes it particularly as right-sided flank/upper abdominal pain that radiated around to the middle of her chest and was described as a heavy pressure sensation.  Both episodes seem to happen [place]  [place]  She saw her PCP recently was concerned about biliary disease and an ultrasound was ordered and demonstrated gallstones.  She does have a referral to surgery.  She was sent here for rule out of acute coronary disease.  EKG shows sinus rhythm with no ischemic changes at 85.  08/11/2018  Stacy Harris returns today for follow-up.  She underwent coronary artery CT scanning which demonstrated normal coronary arteries and a calcium score of 0.  Without evidence of coronary disease she was cleared for cholecystectomy which she successfully underwent and has felt much better since then.  She has subsequently been diagnosed with sleep apnea and is been using CPAP for about a week.  She plans to start and increased exercise routine and diet for overall health improvement.  PMHx:  Past Medical History:  Diagnosis Date  . Anemia   . Arthritis    left knee  . Dysrhythmia    heart racing with hot flashes  . Epicondylitis elbow, medial   . Granular corneal dystrophy   . Headache    Migraines  . Hypertension   . Migraine   . PONV (postoperative nausea and vomiting)   . Right wrist fracture   . Seizures (HCC)    febrile sz at 9 mos  . Varicose veins     Past Surgical  History:  Procedure Laterality Date  . CHOLECYSTECTOMY N/A 06/06/2018   Procedure: LAPAROSCOPIC CHOLECYSTECTOMY;  Surgeon: Abigail MiyamotoBlackman, Douglas, MD;  [facility];  Service: General;  Laterality: N/A;  . DIAGNOSTIC LAPAROSCOPY     ovarian cyst removed  . DILATATION & CURETTAGE/HYSTEROSCOPY WITH MYOSURE N/A 06/06/2015   Procedure: DILATATION & CURETTAGE/HYSTEROSCOPY ;  Surgeon: Marlow Baarsyanna Clark, MD;  Location: [facility]S;  Service:  Gynecology;  Laterality: N/A;  . DILATION AND CURETTAGE OF UTERUS  1998, 2001  . LYMPH NODE DISSECTION  1985  . TENNIS ELBOW RELEASE/NIRSCHEL PROCEDURE Right 07/21/2015   Procedure:  RIGHT ELBOW DEBRIDEMENT AND TENDON REPAIR;  Surgeon: Loreta Aveaniel F Murphy, MD;  Location: [facility];  Service: Orthopedics;  Laterality: Right;  Marland Kitchen. VARICOSE VEIN SURGERY  1986, 2004, 2007  . WRIST ARTHROSCOPY W/ TRIANGULAR FIBROCARTILAGE REPAIR Right 06/08/2009    FAMHx:  Family History  Problem Relation Age of Onset  . Kidney disease Father   . Hypertension Father   . Lung cancer Maternal Grandmother   . Hypertension Maternal Grandfather        ? heart disease  . Kidney disease Paternal Grandmother   . Stroke Paternal Grandfather   . Hypertension Sister   . Uterine cancer Sister   . Breast cancer Maternal Aunt   . Breast cancer Maternal Uncle   . Breast cancer Maternal Aunt     SOCHx:   reports that she has never smoked. She has never used smokeless tobacco. She reports that she does not drink alcohol or use drugs.  ALLERGIES:  Allergies  Allergen Reactions  . Codeine Rash    ROS: A comprehensive review of systems was negative.  HOME MEDS: Current Outpatient Medications  Medication Sig Dispense Refill  . Clindamycin-Benzoyl Per, Refr, gel Apply 1 application topically every morning.   2  . doxycycline (VIBRA-TABS) 100 MG tablet Take 1 tablet by mouth 2 (two) times daily.  0  . FIBER ADULT GUMMIES PO Take 1 tablet by mouth daily.     Marland Kitchen. ibuprofen (ADVIL,MOTRIN) 200 MG tablet Take 800 mg by mouth every 6 (six) hours as needed for moderate pain.    . Multiple Vitamin (MULTIVITAMIN WITH MINERALS) TABS tablet Take 1 tablet by mouth every morning.     . Probiotic Product (ALIGN PO) Take 1 tablet by mouth every morning.      No current facility-administered medications for this visit.     LABS/IMAGING: No results found for this or any previous visit (from the past 48 hour(s)). No  results found.  WEIGHTS: Wt Readings from Last 3 Encounters:  08/11/18 238 lb 6.4 oz (108.1 kg)  06/17/18 236 lb 6.4 oz (107.2 kg)  06/06/18 245 lb 2.4 oz (111.2 kg)    VITALS: BP (!) 142/90   Pulse 86   Ht 5' 5.5" (1.664 m)   Wt 238 lb 6.4 oz (108.1 kg)   BMI 39.07 kg/m   EXAM: Deferred  EKG: Deferred  ASSESSMENT: 1. Upper abdominal, lower chest pain -cholecystitis, s/p cholecystectomy 2. No coronary artery disease and 0 coronary calcium score-CT coronary angiography (05/2018) 3. Abnormal EKG with poor R-wave progression and transition in lead V5 4. Hypertension-controlled 5. Atypical chest pain 6. OSA on CPAP  PLAN: 1.   Stacy Harris was found to have no significant coronary disease and a calcium score of 0 by CT coronary angiography.  She subsequently underwent cholecystectomy and is feeling better.  She was also diagnosed with obstructive sleep apnea is now on CPAP.  I have encouraged continued risk factor modification including dietary changes, weight loss and increase exercise.  She can follow-up with me as needed.  Chrystie Nose, MD, [facility], FACP  [place]  [facility]  Medical Director of the [facility]  [facility] Diplomate of the American Board of Clinical Lipidology Attending Cardiologist  Direct Dial: [phone]  Fax: [phone]  Website:  [email or web address] Nicely Joss Friedel 08/11/2018, 8:23 AM

## 2018-08-11 NOTE — Patient Instructions (Signed)
Medication Instructions:  Continue current medications  Follow-Up: At [facility], you and your health needs are our priority.  As part of our continuing mission to provide you with exceptional heart care, we have created designated Provider Care Teams.  These Care Teams include your primary Cardiologist (physician) and Advanced Practice Providers (APPs -  Physician Assistants and Nurse Practitioners) who all work together to provide you with the care you need, when you need it. You will need a follow up appointment as needed.  Should you need an appointment, please call our office. You may see Chrystie Nose, MD or one of the following Advanced Practice Providers on your designated Care Team: [place], [place] . Micah Flesher, PA-C  Any Other Special Instructions Will Be Listed Below (If Applicable).

## 2018-09-24 ENCOUNTER — Ambulatory Visit: Payer: 59 | Admitting: Pulmonary Disease

## 2018-10-27 ENCOUNTER — Ambulatory Visit: Payer: 59 | Admitting: Pulmonary Disease

## 2019-06-22 ENCOUNTER — Other Ambulatory Visit: Payer: Self-pay | Admitting: Orthopedic Surgery

## 2019-06-22 DIAGNOSIS — M25531 Pain in right wrist: Secondary | ICD-10-CM

## 2019-07-12 ENCOUNTER — Other Ambulatory Visit: Payer: Self-pay

## 2019-07-12 ENCOUNTER — Ambulatory Visit
Admission: RE | Admit: 2019-07-12 | Discharge: 2019-07-12 | Disposition: A | Discharge status: Home / Self Care | Payer: 59 | Source: Ambulatory Visit | Attending: Orthopedic Surgery | Admitting: Orthopedic Surgery

## 2019-07-12 DIAGNOSIS — M25531 Pain in right wrist: Secondary | ICD-10-CM

## 2019-07-16 ENCOUNTER — Other Ambulatory Visit: Payer: Self-pay

## 2019-07-16 ENCOUNTER — Ambulatory Visit (INDEPENDENT_AMBULATORY_CARE_PROVIDER_SITE_OTHER)
Admission: EM | Admit: 2019-07-16 | Discharge: 2019-07-16 | Disposition: A | Discharge status: Home / Self Care | Payer: 59 | Source: Home / Self Care | Attending: Internal Medicine | Admitting: Internal Medicine

## 2019-07-16 ENCOUNTER — Encounter (HOSPITAL_COMMUNITY): Payer: Self-pay

## 2019-07-16 ENCOUNTER — Emergency Department (HOSPITAL_COMMUNITY)
Admission: EM | Admit: 2019-07-16 | Discharge: 2019-07-16 | Disposition: A | Discharge status: Home / Self Care | Payer: 59 | Attending: Emergency Medicine | Admitting: Emergency Medicine

## 2019-07-16 DIAGNOSIS — Z79899 Other long term (current) drug therapy: Secondary | ICD-10-CM | POA: Diagnosis not present

## 2019-07-16 DIAGNOSIS — Z203 Contact with and (suspected) exposure to rabies: Secondary | ICD-10-CM | POA: Insufficient documentation

## 2019-07-16 DIAGNOSIS — Y999 Unspecified external cause status: Secondary | ICD-10-CM | POA: Insufficient documentation

## 2019-07-16 DIAGNOSIS — W5503XA Scratched by cat, initial encounter: Secondary | ICD-10-CM | POA: Insufficient documentation

## 2019-07-16 DIAGNOSIS — Y92015 Private garage of single-family (private) house as the place of occurrence of the external cause: Secondary | ICD-10-CM | POA: Diagnosis not present

## 2019-07-16 DIAGNOSIS — S0185XA Open bite of other part of head, initial encounter: Secondary | ICD-10-CM

## 2019-07-16 DIAGNOSIS — S01112A Laceration without foreign body of left eyelid and periocular area, initial encounter: Secondary | ICD-10-CM | POA: Insufficient documentation

## 2019-07-16 DIAGNOSIS — Y93K9 Activity, other involving animal care: Secondary | ICD-10-CM | POA: Insufficient documentation

## 2019-07-16 DIAGNOSIS — S0081XA Abrasion of other part of head, initial encounter: Secondary | ICD-10-CM

## 2019-07-16 DIAGNOSIS — Z23 Encounter for immunization: Secondary | ICD-10-CM | POA: Diagnosis not present

## 2019-07-16 DIAGNOSIS — I10 Essential (primary) hypertension: Secondary | ICD-10-CM | POA: Insufficient documentation

## 2019-07-16 DIAGNOSIS — S81832A Puncture wound without foreign body, left lower leg, initial encounter: Secondary | ICD-10-CM | POA: Diagnosis not present

## 2019-07-16 DIAGNOSIS — W5501XA Bitten by cat, initial encounter: Secondary | ICD-10-CM | POA: Diagnosis not present

## 2019-07-16 DIAGNOSIS — Z2914 Encounter for prophylactic rabies immune globin: Secondary | ICD-10-CM | POA: Insufficient documentation

## 2019-07-16 MED ORDER — AMOXICILLIN-POT CLAVULANATE 875-125 MG PO TABS: 1.0000 | ORAL_TABLET | Freq: Two times a day (BID) | ORAL | 0 refills | Status: AC

## 2019-07-16 MED ORDER — TETANUS-DIPHTH-ACELL PERTUSSIS 5-2.5-18.5 LF-MCG/0.5 IM SUSP
0.5000 mL | Freq: Once | INTRAMUSCULAR | Status: AC
  Administered 2019-07-16: 0.5 mL via INTRAMUSCULAR
  Filled 2019-07-16: qty 0.5

## 2019-07-16 MED ORDER — RABIES IMMUNE GLOBULIN 150 UNIT/ML IM INJ
20.0000 [IU]/kg | INJECTION | Freq: Once | INTRAMUSCULAR | Status: AC
  Administered 2019-07-16: 2100 [IU] via INTRAMUSCULAR
  Filled 2019-07-16 (×2): qty 14

## 2019-07-16 MED ORDER — RABIES VACCINE, PCEC IM SUSR
1.0000 mL | Freq: Once | INTRAMUSCULAR | Status: AC
  Administered 2019-07-16: 1 mL via INTRAMUSCULAR
  Filled 2019-07-16: qty 1

## 2019-07-16 NOTE — ED Triage Notes (Signed)
Pt presents to the UC with a scratch in the left upper eyelid x 1 day. Pt reports 1 of her cat scratch her left upper eyelid last night.

## 2019-07-16 NOTE — ED Triage Notes (Signed)
Pt seen at [facility] today and sent here for rabies shots. Was attacked by a Geophysicist/field seismologist.

## 2019-07-16 NOTE — ED Provider Notes (Signed)
[facility] Provider Note   CSN: [id number] Arrival date & time: 07/16/19  1902     History Chief Complaint  Patient presents with  . Animal Bite    Stacy Harris is a 53 y.o. female who presents to ED requesting rabies vaccination.  States that last night patient found a stray cat in her garage.  She was concerned about the cold weather and rain and provided shelter for the cat in her garage.  This morning she was trying to feed the cat when she sustained several scratches over her left upper eyelid and left leg by the stray cat.  Patient presented to the urgent care prior to arrival and was sent to the ED for rabies vaccination.  HPI     Past Medical History:  Diagnosis Date  . Anemia   . Arthritis    left knee  . Dysrhythmia    heart racing with hot flashes  . Epicondylitis elbow, medial   . Granular corneal dystrophy   . Headache    Migraines  . Hypertension   . Migraine   . PONV (postoperative nausea and vomiting)   . Right wrist fracture   . Seizures (HCC)    febrile sz at 9 mos  . Varicose veins     Patient Active Problem List   Diagnosis Date Noted  . Cholelithiasis with acute cholecystitis 06/05/2018  . Precordial chest pain 06/03/2018  . Pre-procedure lab exam 06/03/2018  . Sepsis (HCC) 05/05/2016  . Iron deficiency anemia 05/05/2016  . Diverticulitis of large intestine with perforation without abscess or bleeding   . Abnormal EKG 06/16/2015  . Essential hypertension 06/16/2015    Past Surgical History:  Procedure Laterality Date  . CHOLECYSTECTOMY N/A 06/06/2018   Procedure: LAPAROSCOPIC CHOLECYSTECTOMY;  Surgeon: Abigail MiyamotoBlackman, Douglas, MD;  [facility];  Service: General;  Laterality: N/A;  . DIAGNOSTIC LAPAROSCOPY     ovarian cyst removed  . DILATATION & CURETTAGE/HYSTEROSCOPY WITH MYOSURE N/A 06/06/2015   Procedure: DILATATION & CURETTAGE/HYSTEROSCOPY ;  Surgeon: Marlow Baarsyanna Clark, MD;  Location: [facility];   Service: Gynecology;  Laterality: N/A;  . DILATION AND CURETTAGE OF UTERUS  1998, 2001  . LYMPH NODE DISSECTION  1985  . TENNIS ELBOW RELEASE/NIRSCHEL PROCEDURE Right 07/21/2015   Procedure:  RIGHT ELBOW DEBRIDEMENT AND TENDON REPAIR;  Surgeon: Loreta Aveaniel F Murphy, MD;  Location: [facility];  Service: Orthopedics;  Laterality: Right;  Marland Kitchen. VARICOSE VEIN SURGERY  1986, 2004, 2007  . WRIST ARTHROSCOPY W/ TRIANGULAR FIBROCARTILAGE REPAIR Right 06/08/2009     OB History   No obstetric history on file.     Family History  Problem Relation Age of Onset  . Kidney disease Father   . Hypertension Father   . Lung cancer Maternal Grandmother   . Hypertension Maternal Grandfather        ? heart disease  . Kidney disease Paternal Grandmother   . Stroke Paternal Grandfather   . Hypertension Sister   . Uterine cancer Sister   . Breast cancer Maternal Aunt   . Breast cancer Maternal Uncle   . Breast cancer Maternal Aunt     Social History   Tobacco Use  . Smoking status: Never Smoker  . Smokeless tobacco: Never Used  Substance Use Topics  . Alcohol use: No  . Drug use: No    Home Medications Prior to Admission medications   Medication Sig Start Date End Date Taking? Authorizing Provider  amoxicillin-clavulanate (AUGMENTIN) [id number]  MG tablet Take 1 tablet by mouth every 12 (twelve) hours. 07/16/19   LampteyBritta Mccreedy, MD  FIBER ADULT GUMMIES PO Take 1 tablet by mouth daily.     [provider]  Multiple Vitamin (MULTIVITAMIN WITH MINERALS) TABS tablet Take 1 tablet by mouth every morning.     [provider]  Probiotic Product (ALIGN PO) Take 1 tablet by mouth every morning.     [provider]    Allergies    Codeine  Review of Systems   Review of Systems  Constitutional: Negative for chills and fever.  Skin: Positive for wound.  Neurological: Negative for weakness and numbness.    Physical Exam Updated Vital Signs BP (!) 166/100    Pulse 87   Temp 98.3 F (36.8 C) (Oral)   Resp 16   Ht 5' 5.5" (1.664 m)   Wt 104.3 kg   SpO2 99%   BMI 37.69 kg/m   Physical Exam Vitals and nursing note reviewed.  Constitutional:      General: She is not in acute distress.    Appearance: She is well-developed. She is not diaphoretic.  HENT:     Head: Normocephalic and atraumatic.  Eyes:     General: No scleral icterus.    Conjunctiva/sclera: Conjunctivae normal.  Pulmonary:     Effort: Pulmonary effort is normal. No respiratory distress.  Musculoskeletal:     Cervical back: Normal range of motion.  Skin:    Findings: No rash.     Comments: Superficial wounds of the left eyelid and between the eyebrows.  No deep lacerations noted.  Puncture wound to the left lower leg without active bleeding.  Neurological:     Mental Status: She is alert.     ED Results / Procedures / Treatments   Labs (all labs ordered are listed, but only abnormal results are displayed) Labs Reviewed - No data to display  EKG None  Radiology No results found.  Procedures Procedures (including critical care time)  Medications Ordered in ED Medications  rabies immune globulin (HYPERAB/KEDRAB) injection 2,100 Units (has no administration in time range)  rabies vaccine (RABAVERT) injection 1 mL (has no administration in time range)  Tdap (BOOSTRIX) injection 0.5 mL (has no administration in time range)    ED Course  I have reviewed the triage vital signs and the nursing notes.  Pertinent labs & imaging results that were available during my care of the patient were reviewed by me and considered in my medical decision making (see chart for details).    MDM Rules/Calculators/A&P                      53 year old female presents to ED after sustaining scratches by a stray cat this morning.  She had a exam done at the urgent care and was sent to the ED for vaccinations.  She was prescribed Augmentin.  Wound care was performed there. Will order  tetanus as she is unsure of this was given to her in the past 5 to 10 years, rabies immunoglobulin and vaccination and give instructions regarding follow-up and vaccine series.  Patient is hemodynamically stable, in NAD, and able to ambulate in the ED. Evaluation does not show pathology that would require ongoing emergent intervention or inpatient treatment. I explained the diagnosis to the patient. Pain has been managed and has no complaints prior to discharge. Patient is comfortable with above plan and is stable for discharge at this time. All questions  were answered prior to disposition. Strict return precautions for returning to the ED were discussed. Encouraged follow up with PCP.   An After Visit Summary was printed and given to the patient.   Portions of this note were generated with [employer]. Dictation errors may occur despite best attempts at proofreading.  Final Clinical Impression(s) / ED Diagnoses Final diagnoses:  Cat scratch of face, initial encounter  Need for rabies vaccination    Rx / DC Orders ED Discharge Orders    None       Delia Heady, PA-C 07/16/19 2136    Ezequiel Essex, MD 07/16/19 2227

## 2019-07-16 NOTE — ED Provider Notes (Signed)
[place]    CSN: [id number] Arrival date & time: 07/16/19  1724      History   Chief Complaint Chief Complaint  Patient presents with  . Eye Problem    HPI Stacy Harris is a 53 y.o. female comes to the urgent care with complaints of scratch over the left upper eyelid by a stray cat this morning.  The attack was no provoked.  Patient says stray cat found its way to her garage yesterday and she had concerns that the cat may not survive the "night and the rain.  She provided shelter for the cat and was able to pet the cat yesterday with no incident.  This morning after feeding the cat she wanted to pet the cat again and she ended up sustaining the injuries mentioned above.  She was area with soap and water and proceeded to the urgent care after that.  The cat is a stray cat and his vaccination status is unknown.   HPI  Past Medical History:  Diagnosis Date  . Anemia   . Arthritis    left knee  . Dysrhythmia    heart racing with hot flashes  . Epicondylitis elbow, medial   . Granular corneal dystrophy   . Headache    Migraines  . Hypertension   . Migraine   . PONV (postoperative nausea and vomiting)   . Right wrist fracture   . Seizures ([place])    febrile sz at 9 mos  . Varicose veins     Patient Active Problem List   Diagnosis Date Noted  . Cholelithiasis with acute cholecystitis 06/05/2018  . Precordial chest pain 06/03/2018  . Pre-procedure lab exam 06/03/2018  . Sepsis ([place]) 05/05/2016  . Iron deficiency anemia 05/05/2016  . Diverticulitis of large intestine with perforation without abscess or bleeding   . Abnormal EKG 06/16/2015  . Essential hypertension 06/16/2015    Past Surgical History:  Procedure Laterality Date  . CHOLECYSTECTOMY N/A 06/06/2018   Procedure: LAPAROSCOPIC CHOLECYSTECTOMY;  Surgeon: Coralie Keens, MD;  Location: [facility];  Service: General;  Laterality: N/A;  . DIAGNOSTIC LAPAROSCOPY     ovarian cyst removed  . DILATATION &  CURETTAGE/HYSTEROSCOPY WITH MYOSURE N/A 06/06/2015   Procedure: DILATATION & CURETTAGE/HYSTEROSCOPY ;  Surgeon: Jerelyn Charles, MD;  Location: [facility];  Service: Gynecology;  Laterality: N/A;  . [place], 2001  . LYMPH NODE DISSECTION  1985  . TENNIS ELBOW RELEASE/NIRSCHEL PROCEDURE Right 07/21/2015   Procedure:  RIGHT ELBOW DEBRIDEMENT AND TENDON REPAIR;  Surgeon: Ninetta Lights, MD;  Location: [place];  Service: Orthopedics;  Laterality: Right;  Marland Kitchen [place], 2004, 2007  . WRIST ARTHROSCOPY W/ TRIANGULAR FIBROCARTILAGE REPAIR Right 06/08/2009    OB History   No obstetric history on file.      Home Medications    Prior to Admission medications   Medication Sig Start Date End Date Taking? Authorizing Provider  amoxicillin-clavulanate (AUGMENTIN) 875-125 MG tablet Take 1 tablet by mouth every 12 (twelve) hours. 07/16/19   LampteyMyrene Galas, MD  FIBER ADULT GUMMIES PO Take 1 tablet by mouth daily.     [provider]  Multiple Vitamin (MULTIVITAMIN WITH MINERALS) TABS tablet Take 1 tablet by mouth every morning.     [provider]  Probiotic Product (ALIGN PO) Take 1 tablet by mouth every morning.     [provider]    Family History Family  History  Problem Relation Age of Onset  . Kidney disease Father   . Hypertension Father   . Lung cancer Maternal Grandmother   . Hypertension Maternal Grandfather        ? heart disease  . Kidney disease Paternal Grandmother   . Stroke Paternal Grandfather   . Hypertension Sister   . Uterine cancer Sister   . Breast cancer Maternal Aunt   . Breast cancer Maternal Uncle   . Breast cancer Maternal Aunt     Social History Social History   Tobacco Use  . Smoking status: Never Smoker  . Smokeless tobacco: Never Used  Substance Use Topics  . Alcohol use: No  . Drug use: No     Allergies   Codeine   Review of Systems Review of Systems    Constitutional: Negative for activity change, chills and fever.  HENT: Negative.   Respiratory: Negative.   Cardiovascular: Negative.   Gastrointestinal: Negative.   Genitourinary: Negative.   Skin: Positive for color change and wound.  Neurological: Negative.      Physical Exam Triage Vital Signs ED Triage Vitals  Enc Vitals Group     BP 07/16/19 1741 (!) 164/85     Pulse Rate 07/16/19 1741 99     Resp 07/16/19 1741 15     Temp 07/16/19 1741 98.2 F (36.8 C)     Temp Source 07/16/19 1741 Oral     SpO2 07/16/19 1741 97 %     Weight --      Height --      Head Circumference --      Peak Flow --      Pain Score 07/16/19 1740 10     Pain Loc --      Pain Edu? --      Excl. in GC? --    No data found.  Updated Vital Signs BP (!) 164/85 (BP Location: Left Arm)   Pulse 99   Temp 98.2 F (36.8 C) (Oral)   Resp 15   SpO2 97%   Visual Acuity Right Eye Distance: 20/40 Left Eye Distance: 20/50 Bilateral Distance: 20/40  Right Eye Near:   Left Eye Near:    Bilateral Near:     Physical Exam Constitutional:      General: She is in acute distress.     Appearance: Normal appearance. She is not ill-appearing.  HENT:     Head: Normocephalic and atraumatic.     Right Ear: Tympanic membrane normal.     Left Ear: Tympanic membrane normal.  Eyes:     Comments: Left upper eyelid bruise with superficial lacerations over the left upper eyelid.  Extraocular movement intact.  No corneal or conjunctival changes.  Visual acuity is unchanged.  Cardiovascular:     Rate and Rhythm: Normal rate and regular rhythm.     Pulses: Normal pulses.  Pulmonary:     Effort: Pulmonary effort is normal.     Breath sounds: Normal breath sounds.  Abdominal:     General: Bowel sounds are normal.     Palpations: Abdomen is soft.  Skin:    Capillary Refill: Capillary refill takes less than 2 seconds.  Neurological:     Mental Status: She is alert.      UC Treatments / Results   Labs (all labs ordered are listed, but only abnormal results are displayed) Labs Reviewed - No data to display  EKG   Radiology No results found.  Procedures Procedures (including critical  care time)  Medications Ordered in UC Medications - No data to display  Initial Impression / Assessment and Plan / UC Course  I have reviewed the triage vital signs and the nursing notes.  Pertinent labs & imaging results that were available during my care of the patient were reviewed by me and considered in my medical decision making (see chart for details).     1.  Cat scratch of the left upper eyelid: Augmentin 875-125 mg p.o. twice daily Patient currently has a cat in a cage.  Animal control will have to be informed to get involved in determining what to do with a cat. Patient will go to the emergency department for rabies immunoglobulin and vaccine given that the immunization status of the cat is unknown and it being a stray cat.  If patient develops worsening swelling in the left upper eyelid with or without fever she is advised to return to the urgent care to be reevaluated.  There is currently no signs of preseptal cellulitis. Final Clinical Impressions(s) / UC Diagnoses   Final diagnoses:  Cat bite of face, initial encounter   Discharge Instructions   None    ED Prescriptions    Medication Sig Dispense Auth. Provider   amoxicillin-clavulanate (AUGMENTIN) 875-125 MG tablet Take 1 tablet by mouth every 12 (twelve) hours. 14 tablet Keyuna Cuthrell, Britta Mccreedy, MD     PDMP not reviewed this encounter.   Merrilee Jansky, MD 07/16/19 1910

## 2019-07-16 NOTE — ED Notes (Signed)
Patient is being discharged to the ED for initial rabies vaccination. Pt provided instructions for follow-up vaccination series, wound care education provided.

## 2019-07-16 NOTE — Discharge Instructions (Addendum)
Take the antibiotics as prescribed. Follow the instructions regarding subsequent doses of the rabies vaccination. Return to the ED if you start to experience any symptoms of infection including redness, swelling, pus draining from the area or fevers.

## 2019-07-19 ENCOUNTER — Other Ambulatory Visit: Payer: Self-pay

## 2019-07-19 ENCOUNTER — Ambulatory Visit (HOSPITAL_COMMUNITY)
Admission: EM | Admit: 2019-07-19 | Discharge: 2019-07-19 | Disposition: A | Discharge status: Home / Self Care | Payer: 59 | Attending: Family Medicine | Admitting: Family Medicine

## 2019-07-19 DIAGNOSIS — Z203 Contact with and (suspected) exposure to rabies: Secondary | ICD-10-CM

## 2019-07-19 DIAGNOSIS — Z23 Encounter for immunization: Secondary | ICD-10-CM | POA: Diagnosis not present

## 2019-07-19 MED ORDER — RABIES VACCINE, PCEC IM SUSR: 1.0000 mL | Freq: Once | INTRAMUSCULAR | Status: DC

## 2019-07-19 MED ORDER — RABIES VACCINE, PCEC IM SUSR
INTRAMUSCULAR | Status: AC
  Filled 2019-07-19: qty 1

## 2019-07-19 NOTE — ED Triage Notes (Signed)
Pt seen at [facility] today for rabies shot. She was attacked by a Geophysicist/field seismologist

## 2019-07-19 NOTE — ED Notes (Signed)
Pt is being discharged from having her rabies shot

## 2019-07-23 ENCOUNTER — Ambulatory Visit (HOSPITAL_COMMUNITY)
Admission: EM | Admit: 2019-07-23 | Discharge: 2019-07-23 | Disposition: A | Discharge status: Home / Self Care | Payer: 59 | Attending: Family Medicine | Admitting: Family Medicine

## 2019-07-23 ENCOUNTER — Encounter (HOSPITAL_COMMUNITY): Payer: Self-pay

## 2019-07-23 ENCOUNTER — Other Ambulatory Visit: Payer: Self-pay

## 2019-07-23 DIAGNOSIS — Z23 Encounter for immunization: Secondary | ICD-10-CM | POA: Diagnosis not present

## 2019-07-23 DIAGNOSIS — Z203 Contact with and (suspected) exposure to rabies: Secondary | ICD-10-CM | POA: Diagnosis not present

## 2019-07-23 MED ORDER — RABIES VACCINE, PCEC IM SUSR
INTRAMUSCULAR | Status: AC
  Filled 2019-07-23: qty 1

## 2019-07-23 MED ORDER — RABIES VACCINE, PCEC IM SUSR
1.0000 mL | Freq: Once | INTRAMUSCULAR | Status: AC
  Administered 2019-07-23: 1 mL via INTRAMUSCULAR

## 2019-07-23 NOTE — ED Triage Notes (Signed)
Patient here for day 7 of rabies series, given in left deltoid.

## 2019-07-30 ENCOUNTER — Ambulatory Visit (HOSPITAL_COMMUNITY)
Admission: EM | Admit: 2019-07-30 | Discharge: 2019-07-30 | Disposition: A | Discharge status: Home / Self Care | Payer: 59 | Attending: Internal Medicine | Admitting: Internal Medicine

## 2019-07-30 ENCOUNTER — Encounter (HOSPITAL_COMMUNITY): Payer: Self-pay

## 2019-07-30 DIAGNOSIS — Z23 Encounter for immunization: Secondary | ICD-10-CM | POA: Diagnosis not present

## 2019-07-30 DIAGNOSIS — Z203 Contact with and (suspected) exposure to rabies: Secondary | ICD-10-CM

## 2019-07-30 MED ORDER — RABIES VACCINE, PCEC IM SUSR
1.0000 mL | Freq: Once | INTRAMUSCULAR | Status: AC
  Administered 2019-07-30: 1 mL via INTRAMUSCULAR

## 2019-07-30 MED ORDER — RABIES VACCINE, PCEC IM SUSR
INTRAMUSCULAR | Status: AC
  Filled 2019-07-30: qty 1

## 2019-07-30 NOTE — ED Notes (Signed)
I called [facility] to ask if is ok to use the right deltoid as the pt had a right surgery and they were not able to answer the question as they do not handle this medication. Dr. Lanny Cramp said is ok, if the pt tolerated and she agree. Pt agreed.

## 2019-07-30 NOTE — ED Triage Notes (Signed)
Pt presents to the UC for the last rabies vaccine.

## 2022-06-29 DIAGNOSIS — Z419 Encounter for procedure for purposes other than remedying health state, unspecified: Secondary | ICD-10-CM | POA: Diagnosis not present

## 2022-07-30 DIAGNOSIS — Z419 Encounter for procedure for purposes other than remedying health state, unspecified: Secondary | ICD-10-CM | POA: Diagnosis not present

## 2022-08-07 ENCOUNTER — Other Ambulatory Visit: Payer: Self-pay

## 2022-08-07 ENCOUNTER — Emergency Department (HOSPITAL_BASED_OUTPATIENT_CLINIC_OR_DEPARTMENT_OTHER): Payer: Medicaid Other

## 2022-08-07 ENCOUNTER — Emergency Department (HOSPITAL_BASED_OUTPATIENT_CLINIC_OR_DEPARTMENT_OTHER)
Admission: EM | Admit: 2022-08-07 | Discharge: 2022-08-07 | Disposition: A | Discharge status: Home / Self Care | Payer: Medicaid Other | Attending: Emergency Medicine | Admitting: Emergency Medicine

## 2022-08-07 DIAGNOSIS — S0990XA Unspecified injury of head, initial encounter: Secondary | ICD-10-CM | POA: Diagnosis not present

## 2022-08-07 DIAGNOSIS — S060X0A Concussion without loss of consciousness, initial encounter: Secondary | ICD-10-CM | POA: Insufficient documentation

## 2022-08-07 DIAGNOSIS — W208XXA Other cause of strike by thrown, projected or falling object, initial encounter: Secondary | ICD-10-CM | POA: Insufficient documentation

## 2022-08-07 DIAGNOSIS — R519 Headache, unspecified: Secondary | ICD-10-CM | POA: Diagnosis present

## 2022-08-07 DIAGNOSIS — I10 Essential (primary) hypertension: Secondary | ICD-10-CM | POA: Diagnosis not present

## 2022-08-07 NOTE — Discharge Instructions (Signed)
Take Tylenol 1000 mg every 6 hours as needed for pain.  Follow-up with primary doctor if symptoms or not improving in the next week, and return to the ER if symptoms significantly worsen or change.

## 2022-08-07 NOTE — ED Triage Notes (Signed)
Patient states she was grocery shopping 2x hours ago and was grabbing a 2L soda off the top shelf when another one fell onto the R side of her head. Denies LOC, a fall, states she felt dizzy. Patient aaxo4, ambulatory, states she has a headache at this time.

## 2022-08-07 NOTE — ED Provider Notes (Signed)
[facility] EMERGENCY DEPT Provider Note   CSN: [id number] Arrival date & time: 08/07/22  0033     History  Chief Complaint  Patient presents with   Head Injury    Stacy Harris is a 57 y.o. female.  Patient is a 57 year old female with past medical history of hypertension, migraines.  Patient presenting today for evaluation of a head injury.  Patient was at [facility] reaching for a 2 L bottle of soda on the top shelf when the bottle beside it tipped over and hit her to the right side of her forehead and scalp.  She describes instant headache and dizziness, but no loss of consciousness.  She continues with the pain to the forehead and scalp.  She denies any visual disturbances.  She denies any numbness or tingling.  The history is provided by the patient.       Home Medications Prior to Admission medications   Medication Sig Start Date End Date Taking? Authorizing Provider  amoxicillin-clavulanate (AUGMENTIN) 875-125 MG tablet Take 1 tablet by mouth every 12 (twelve) hours. 07/16/19   LampteyMyrene Galas, MD  FIBER ADULT GUMMIES PO Take 1 tablet by mouth daily.     [provider]  Multiple Vitamin (MULTIVITAMIN WITH MINERALS) TABS tablet Take 1 tablet by mouth every morning.     [provider]  Probiotic Product (ALIGN PO) Take 1 tablet by mouth every morning.     [provider]      Allergies    Codeine    Review of Systems   Review of Systems  All other systems reviewed and are negative.   Physical Exam Updated Vital Signs BP (!) 164/120 (BP Location: Right Arm)   Pulse 79   Temp 97.8 F (36.6 C) (Oral)   Resp 18   Ht 5\' 5"  (1.651 m)   Wt 99.8 kg   SpO2 100%   BMI 36.61 kg/m  Physical Exam Vitals and nursing note reviewed.  Constitutional:      General: She is not in acute distress.    Appearance: Normal appearance. She is not ill-appearing.  HENT:     Head: Normocephalic and atraumatic.     Comments: There is  no swelling or ecchymosis of the scalp or forehead noted.  There are no overt signs of trauma. Pulmonary:     Effort: Pulmonary effort is normal.  Skin:    General: Skin is warm and dry.  Neurological:     General: No focal deficit present.     Mental Status: She is alert and oriented to person, place, and time. Mental status is at baseline.     Cranial Nerves: No cranial nerve deficit.     Motor: No weakness.     ED Results / Procedures / Treatments   Labs (all labs ordered are listed, but only abnormal results are displayed) Labs Reviewed - No data to display  EKG None  Radiology No results found.  Procedures Procedures    Medications Ordered in ED Medications - No data to display  ED Course/ Medical Decision Making/ A&P  CT scan of the head unremarkable for intracranial injury.  Patient to be treated as a contusion to the scalp/mild concussion.  To follow-up as needed for any problems.  Final Clinical Impression(s) / ED Diagnoses Final diagnoses:  None    Rx / DC Orders ED Discharge Orders     None         Veryl Speak, MD 08/07/22  0422  

## 2022-08-08 ENCOUNTER — Telehealth: Payer: Self-pay

## 2022-08-08 NOTE — Patient Outreach (Signed)
Transition Care Management Unsuccessful Follow-up Telephone Call  Date of discharge and from where:  08/07/22 [facility]  Attempts:  1st Attempt  Reason for unsuccessful TCM follow-up call:  Left voice message

## 2022-08-30 DIAGNOSIS — Z419 Encounter for procedure for purposes other than remedying health state, unspecified: Secondary | ICD-10-CM | POA: Diagnosis not present

## 2022-09-28 DIAGNOSIS — Z419 Encounter for procedure for purposes other than remedying health state, unspecified: Secondary | ICD-10-CM | POA: Diagnosis not present

## 2022-10-29 DIAGNOSIS — Z419 Encounter for procedure for purposes other than remedying health state, unspecified: Secondary | ICD-10-CM | POA: Diagnosis not present

## 2022-11-28 DIAGNOSIS — Z419 Encounter for procedure for purposes other than remedying health state, unspecified: Secondary | ICD-10-CM | POA: Diagnosis not present

## 2022-12-29 DIAGNOSIS — Z419 Encounter for procedure for purposes other than remedying health state, unspecified: Secondary | ICD-10-CM | POA: Diagnosis not present

## 2023-01-28 DIAGNOSIS — Z419 Encounter for procedure for purposes other than remedying health state, unspecified: Secondary | ICD-10-CM | POA: Diagnosis not present

## 2023-02-28 DIAGNOSIS — Z419 Encounter for procedure for purposes other than remedying health state, unspecified: Secondary | ICD-10-CM | POA: Diagnosis not present

## 2023-03-31 DIAGNOSIS — Z419 Encounter for procedure for purposes other than remedying health state, unspecified: Secondary | ICD-10-CM | POA: Diagnosis not present

## 2023-04-30 DIAGNOSIS — Z419 Encounter for procedure for purposes other than remedying health state, unspecified: Secondary | ICD-10-CM | POA: Diagnosis not present

## 2023-05-31 DIAGNOSIS — Z419 Encounter for procedure for purposes other than remedying health state, unspecified: Secondary | ICD-10-CM | POA: Diagnosis not present

## 2023-06-30 DIAGNOSIS — Z419 Encounter for procedure for purposes other than remedying health state, unspecified: Secondary | ICD-10-CM | POA: Diagnosis not present

## 2023-07-31 DIAGNOSIS — Z419 Encounter for procedure for purposes other than remedying health state, unspecified: Secondary | ICD-10-CM | POA: Diagnosis not present

## 2023-08-31 DIAGNOSIS — Z419 Encounter for procedure for purposes other than remedying health state, unspecified: Secondary | ICD-10-CM | POA: Diagnosis not present

## 2023-09-28 DIAGNOSIS — Z419 Encounter for procedure for purposes other than remedying health state, unspecified: Secondary | ICD-10-CM | POA: Diagnosis not present

## 2023-11-09 DIAGNOSIS — Z419 Encounter for procedure for purposes other than remedying health state, unspecified: Secondary | ICD-10-CM | POA: Diagnosis not present

## 2023-12-09 DIAGNOSIS — Z419 Encounter for procedure for purposes other than remedying health state, unspecified: Secondary | ICD-10-CM | POA: Diagnosis not present

## 2024-01-09 DIAGNOSIS — Z419 Encounter for procedure for purposes other than remedying health state, unspecified: Secondary | ICD-10-CM | POA: Diagnosis not present

## 2024-02-08 DIAGNOSIS — Z419 Encounter for procedure for purposes other than remedying health state, unspecified: Secondary | ICD-10-CM | POA: Diagnosis not present

## 2024-03-10 DIAGNOSIS — Z419 Encounter for procedure for purposes other than remedying health state, unspecified: Secondary | ICD-10-CM | POA: Diagnosis not present

## 2024-04-10 DIAGNOSIS — Z419 Encounter for procedure for purposes other than remedying health state, unspecified: Secondary | ICD-10-CM | POA: Diagnosis not present
